# Patient Record
Sex: Female | Born: 1960 | Race: White | Hispanic: No | Marital: Married | Smoking: Never smoker
Health system: Southern US, Community
[De-identification: ages and names within clinical notes are randomized; demographics above are authoritative.]

## PROBLEM LIST (undated history)

## (undated) DIAGNOSIS — I10 Essential (primary) hypertension: Secondary | ICD-10-CM

## (undated) DIAGNOSIS — R011 Cardiac murmur, unspecified: Secondary | ICD-10-CM

## (undated) DIAGNOSIS — E785 Hyperlipidemia, unspecified: Secondary | ICD-10-CM

## (undated) HISTORY — DX: Hyperlipidemia, unspecified: E78.5

## (undated) HISTORY — DX: Cardiac murmur, unspecified: R01.1

## (undated) HISTORY — DX: Essential (primary) hypertension: I10

## (undated) HISTORY — PX: TONSILLECTOMY AND ADENOIDECTOMY: SUR1326

## (undated) HISTORY — PX: BREAST CYST ASPIRATION: SHX578

## (undated) HISTORY — PX: LAPAROSCOPIC ENDOMETRIOSIS FULGURATION: SUR769

## (undated) HISTORY — PX: CHOLECYSTECTOMY: SHX55

---

## 2019-09-16 ENCOUNTER — Encounter: Payer: Self-pay | Admitting: Internal Medicine

## 2019-09-16 ENCOUNTER — Ambulatory Visit (INDEPENDENT_AMBULATORY_CARE_PROVIDER_SITE_OTHER): Payer: 59 | Admitting: Internal Medicine

## 2019-09-16 VITALS — BP 122/78 | HR 69 | Temp 97.5°F | Wt 104.0 lb

## 2019-09-16 DIAGNOSIS — F419 Anxiety disorder, unspecified: Secondary | ICD-10-CM | POA: Diagnosis not present

## 2019-09-16 DIAGNOSIS — F329 Major depressive disorder, single episode, unspecified: Secondary | ICD-10-CM

## 2019-09-16 DIAGNOSIS — I10 Essential (primary) hypertension: Secondary | ICD-10-CM

## 2019-09-16 DIAGNOSIS — E7800 Pure hypercholesterolemia, unspecified: Secondary | ICD-10-CM

## 2019-09-16 DIAGNOSIS — R000 Tachycardia, unspecified: Secondary | ICD-10-CM

## 2019-09-16 DIAGNOSIS — L603 Nail dystrophy: Secondary | ICD-10-CM

## 2019-09-16 DIAGNOSIS — F32A Depression, unspecified: Secondary | ICD-10-CM

## 2019-09-16 DIAGNOSIS — L659 Nonscarring hair loss, unspecified: Secondary | ICD-10-CM

## 2019-09-19 ENCOUNTER — Encounter: Payer: Self-pay | Admitting: Internal Medicine

## 2019-09-19 DIAGNOSIS — R000 Tachycardia, unspecified: Secondary | ICD-10-CM | POA: Insufficient documentation

## 2019-09-19 DIAGNOSIS — E785 Hyperlipidemia, unspecified: Secondary | ICD-10-CM | POA: Insufficient documentation

## 2019-09-19 DIAGNOSIS — F32A Depression, unspecified: Secondary | ICD-10-CM | POA: Insufficient documentation

## 2019-09-19 DIAGNOSIS — I10 Essential (primary) hypertension: Secondary | ICD-10-CM | POA: Insufficient documentation

## 2019-09-19 NOTE — Assessment & Plan Note (Signed)
We will continue Ativan as needed

## 2019-09-19 NOTE — Assessment & Plan Note (Signed)
Heart rate within normal limits We will monitor

## 2019-09-19 NOTE — Assessment & Plan Note (Signed)
We will check lipid with annual exam Encouraged her to consume a low-fat diet We will monitor

## 2019-09-19 NOTE — Assessment & Plan Note (Signed)
Continue Losartan and HCTZ We will monitor

## 2019-09-19 NOTE — Progress Notes (Signed)
HPI  Patient presents to the clinic today to establish care and for management of the conditions listed below.  She recently moved from [place].  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  This occurred after the death of her mother but has not been an issue since.  She reports intermittent anxiety for which she takes Ativan every 5 to 6 months as needed.  She is not currently seeing a therapist.  She denies SI/HI.  Tachyarrhythmia: She reports she was unable to tolerate Metoprolol, so she was taken off of this.  She reports her heart rate is generally fine.  HTN: Her BP today is 122/78.  She is taking HCTZ and Losartan as prescribed.  There is no ECG on file.  HLD: She reports she had to stop taking her statin due to leg cramps.  She was switched to Rosuvastatin but this caused memory loss.  She reports she was taken off of her cholesterol medication and is not currently taking anything OTC for this.  She tries to consume a low-fat diet.  She reports history of elevated liver enzymes and family history of idiopathic hepatic fibrosis.  She is also concerned that her nails seem to be peeling and her hair seems to be thinning.  She reports she has never had a thyroid problem.  Past Medical History:  Diagnosis Date  . Hyperlipidemia   . Hypertension     Current Outpatient Medications  Medication Sig Dispense Refill  . Cholecalciferol (VITAMIN D) 50 MCG (2000 UT) CAPS Take by mouth.    . estradiol (ESTRACE) 0.1 MG/GM vaginal cream Place 1 Applicatorful vaginally at bedtime.    . hydrochlorothiazide (HYDRODIURIL) 12.5 MG tablet Take 12.5 mg by mouth daily.    Marland Kitchen LORazepam (ATIVAN) 0.5 MG tablet Take 0.5 mg by mouth as needed for anxiety.    Marland Kitchen losartan (COZAAR) 50 MG tablet Take 50 mg by mouth daily.     No current facility-administered medications for this visit.    Allergies  Allergen Reactions  . Lisinopril Other (See Comments)    Dry cough    . Metoprolol  Other (See Comments)    Vivid dreams, dizziness    Family History  Problem Relation Age of Onset  . Lymphoma Mother   . Hypertension Mother   . Hyperlipidemia Mother   . Arthritis Father   . Hyperlipidemia Father   . Hypertension Father   . Stroke Father   . Hyperlipidemia Brother   . Hypertension Brother   . Prostate cancer Brother   . Skin cancer Brother   . Arthritis Maternal Grandmother   . Lymphoma Maternal Grandmother   . Stroke Maternal Grandmother   . Brain cancer Maternal Grandfather   . Hypertension Maternal Grandfather   . Hyperlipidemia Maternal Grandfather   . Heart disease Maternal Grandfather   . Heart disease Paternal Grandmother   . Hyperlipidemia Paternal Grandmother   . Hypertension Paternal Grandmother   . Stroke Paternal Grandfather   . Arthritis Paternal Grandfather   . Hypertension Brother   . Hyperlipidemia Brother   . Prostate cancer Brother   . Skin cancer Brother     Social History   Socioeconomic History  . Marital status: Married    Spouse name: Not on file  . Number of children: Not on file  . Years of education: Not on file  . Highest education level: Not on file  Occupational History  . Not on file  Tobacco Use  . Smoking  status: Never Smoker  . Smokeless tobacco: Never Used  Substance and Sexual Activity  . Alcohol use: Yes    Comment: 2 glasses at night  . Drug use: Not on file  . Sexual activity: Not on file  Other Topics Concern  . Not on file  Social History Narrative  . Not on file   Social Determinants of Health   Financial Resource Strain:   . Difficulty of Paying Living Expenses:   Food Insecurity:   . Worried About Programme researcher, broadcasting/film/video in the Last Year:   . Barista in the Last Year:   Transportation Needs:   . Freight forwarder (Medical):   Marland Kitchen Lack of Transportation (Non-Medical):   Physical Activity:   . Days of Exercise per Week:   . Minutes of Exercise per Session:   Stress:   . Feeling of  Stress :   Social Connections:   . Frequency of Communication with Friends and Family:   . Frequency of Social Gatherings with Friends and Family:   . Attends Religious Services:   . Active Member of Clubs or Organizations:   . Attends Banker Meetings:   Marland Kitchen Marital Status:   Intimate Partner Violence:   . Fear of Current or Ex-Partner:   . Emotionally Abused:   Marland Kitchen Physically Abused:   . Sexually Abused:     ROS:  Constitutional: Denies fever, malaise, fatigue, headache or abrupt weight changes.  HEENT: Denies eye pain, eye redness, ear pain, ringing in the ears, wax buildup, runny nose, nasal congestion, bloody nose, or sore throat. Respiratory: Denies difficulty breathing, shortness of breath, cough or sputum production.   Cardiovascular: Denies chest pain, chest tightness, palpitations or swelling in the hands or feet.  Gastrointestinal: Denies abdominal pain, bloating, constipation, diarrhea or blood in the stool.  GU: Denies frequency, urgency, pain with urination, blood in urine, odor or discharge. Musculoskeletal: Denies decrease in range of motion, difficulty with gait, muscle pain or joint pain and swelling.  Skin: Patient reports hair thinning, nail peeling.  Denies redness, rashes, lesions or ulcercations.  Neurological: Denies dizziness, difficulty with memory, difficulty with speech or problems with balance and coordination.  Psych: Patient reports intermittent anxiety.  Denies anxiety, depression, SI/HI.  No other specific complaints in a complete review of systems (except as listed in HPI above).  PE:  BP 122/78   Pulse 69   Temp (!) 97.5 F (36.4 C) (Temporal)   Wt 104 lb (47.2 kg)   SpO2 98%  Wt Readings from Last 3 Encounters:  09/16/19 104 lb (47.2 kg)    General: Appears her stated age, well developed, well nourished in NAD. Skin: Superficial peeling of multiple nails starting at the tips.  No bald spots noted HEENT: Head: normal shape and  size; Eyes: sclera white, no icterus, conjunctiva pink, PERRLA and EOMs intact; Neck: Neck supple, trachea midline. No masses, lumps or thyromegaly present.  Cardiovascular: Normal rate and rhythm. S1,S2 noted.  No murmur, rubs or gallops noted. No JVD or BLE edema. No carotid bruits noted. Pulmonary/Chest: Normal effort and positive vesicular breath sounds. No respiratory distress. No wheezes, rales or ronchi noted.  Musculoskeletal:  No difficulty with gait.  Neurological: Alert and oriented.  Coordination normal.  Psychiatric: Mood and affect normal. Behavior is normal. Judgment and thought content normal.     Assessment and Plan:  Nail Peeling, Hair Thinning:  We will have her try biotin 10,000 mcg daily  Make  an appointment for your annual exam Nicki Reaper, NP This visit occurred during the SARS-CoV-2 public health emergency.  Safety protocols were in place, including screening questions prior to the visit, additional usage of staff PPE, and extensive cleaning of exam room while observing appropriate contact time as indicated for disinfecting solutions.

## 2019-09-19 NOTE — Patient Instructions (Signed)
Alopecia Areata, Adult  Alopecia areata is a condition that causes you to lose hair. You may lose hair on your scalp in patches. In some cases, you may lose all the hair on your scalp (alopecia totalis) or all the hair from your face and body (alopecia universalis). Alopecia areata is an autoimmune disease. This means that your body's defense system (immune system) mistakes normal parts of the body for germs or other things that can make you sick. When you have alopecia areata, the immune system attacks the hair follicles. Alopecia areata usually develops in childhood, but it can develop at any age. For some people, their hair grows back on its own and hair loss does not happen again. For others, their hair may fall out and grow back in cycles. The hair loss may last many years. Having this condition can be emotionally difficult, but it is not dangerous. What are the causes? The cause of this condition is not known. What increases the risk? This condition is more likely to develop in people who have:  A family history of alopecia.  A family history of another autoimmune disease, including type 1 diabetes and rheumatoid arthritis.  Asthma and allergies.  Down syndrome. What are the signs or symptoms? Round spots of patchy hair loss on the scalp is the main symptom of this condition. The spots may be mildly itchy. Other symptoms include:  Short dark hairs in the bald patches that are wider at the top (exclamation point hairs).  Dents, white spots, or lines in the fingernails or toenails.  Balding and body hair loss. This is rare. How is this diagnosed? This condition is diagnosed based on your symptoms and family history. Your health care provider will also check your scalp skin, teeth, and nails. Your health care provider may refer you to a specialist in hair and skin disorders (dermatologist). You may also have tests, including:  A hair pull test.  Blood tests or other screening tests  to check for autoimmune diseases, such as thyroid disease or diabetes.  Skin biopsy to confirm the diagnosis.  A procedure to examine the skin with a lighted magnifying instrument (dermoscopy). How is this treated? There is no cure for alopecia areata. Treatment is aimed at promoting the regrowth of hair and preventing the immune system from overreacting. No single treatment is right for all people with alopecia areata. It depends on the type of hair loss you have and how severe it is. Work with your health care provider to find the best treatment for you. Treatment may include:  Having regular checkups to make sure the condition is not getting worse (watchful waiting).  Steroid creams or pills for 6-8 weeks to stop the immune reaction and help hair to regrow more quickly.  Other topical medicines to alter the immune system response and support the hair growth cycle.  Steroid injections.  Therapy and counseling with a support group or therapist if you are having trouble coping with hair loss. Follow these instructions at home:  Learn as much as you can about your condition.  Apply topical creams only as told by your health care provider.  Take over-the-counter and prescription medicines only as told by your health care provider.  Consider getting a wig or products to make hair look fuller or to cover bald spots, if you feel uncomfortable with your appearance.  Get therapy or counseling if you are having a hard time coping with hair loss. Ask your health care provider to recommend   a counselor or support group.  Keep all follow-up visits as told by your health care provider. This is important. Contact a health care provider if:  Your hair loss gets worse, even with treatment.  You have new symptoms.  You are struggling emotionally. Summary  Alopecia areata is an autoimmune condition that makes your body's defense system (immune system) attack the hair follicles. This causes you  to lose hair.  Treatments may include regular checkups to make sure that the condition is not getting worse (watchful waiting), medicines, and steroid injections. This information is not intended to replace advice given to you by your health care provider. Make sure you discuss any questions you have with your health care provider. Document Revised: 01/30/2017 Document Reviewed: 03/07/2016 Elsevier Patient Education  2020 Elsevier Inc.  

## 2019-11-14 ENCOUNTER — Other Ambulatory Visit: Payer: Self-pay

## 2019-11-14 ENCOUNTER — Ambulatory Visit (INDEPENDENT_AMBULATORY_CARE_PROVIDER_SITE_OTHER)
Admission: RE | Admit: 2019-11-14 | Discharge: 2019-11-14 | Disposition: A | Discharge status: Home / Self Care | Payer: 59 | Source: Ambulatory Visit | Attending: Internal Medicine | Admitting: Internal Medicine

## 2019-11-14 ENCOUNTER — Encounter: Payer: Self-pay | Admitting: Internal Medicine

## 2019-11-14 ENCOUNTER — Ambulatory Visit (INDEPENDENT_AMBULATORY_CARE_PROVIDER_SITE_OTHER): Payer: 59 | Admitting: Internal Medicine

## 2019-11-14 VITALS — BP 118/78 | HR 81 | Temp 98.2°F | Wt 103.0 lb

## 2019-11-14 DIAGNOSIS — M79604 Pain in right leg: Secondary | ICD-10-CM | POA: Diagnosis not present

## 2019-11-14 DIAGNOSIS — L659 Nonscarring hair loss, unspecified: Secondary | ICD-10-CM | POA: Diagnosis not present

## 2019-11-14 DIAGNOSIS — M25551 Pain in right hip: Secondary | ICD-10-CM

## 2019-11-14 DIAGNOSIS — L609 Nail disorder, unspecified: Secondary | ICD-10-CM | POA: Diagnosis not present

## 2019-11-14 DIAGNOSIS — M545 Low back pain, unspecified: Secondary | ICD-10-CM

## 2019-11-14 DIAGNOSIS — R5382 Chronic fatigue, unspecified: Secondary | ICD-10-CM

## 2019-11-14 DIAGNOSIS — E7800 Pure hypercholesterolemia, unspecified: Secondary | ICD-10-CM | POA: Diagnosis not present

## 2019-11-14 DIAGNOSIS — M79605 Pain in left leg: Secondary | ICD-10-CM | POA: Diagnosis not present

## 2019-11-14 LAB — COMPREHENSIVE METABOLIC PANEL
ALT: 23 U/L (ref 0–35)
AST: 25 U/L (ref 0–37)
Albumin: 4.6 g/dL (ref 3.5–5.2)
Alkaline Phosphatase: 39 U/L (ref 39–117)
BUN: 10 mg/dL (ref 6–23)
CO2: 28 mEq/L (ref 19–32)
Calcium: 9.8 mg/dL (ref 8.4–10.5)
Chloride: 101 mEq/L (ref 96–112)
Creatinine, Ser: 0.67 mg/dL (ref 0.40–1.20)
GFR: 89.66 mL/min (ref >60.00)
Glucose, Bld: 82 mg/dL (ref 70–99)
Potassium: 4.2 mEq/L (ref 3.5–5.1)
Sodium: 141 mEq/L (ref 135–145)
Total Bilirubin: 0.6 mg/dL (ref 0.2–1.2)
Total Protein: 7.2 g/dL (ref 6.0–8.3)

## 2019-11-14 LAB — LIPID PANEL
Cholesterol: 229 mg/dL — ABNORMAL HIGH (ref 0–200)
HDL: 70.5 mg/dL (ref >39.00)
LDL Cholesterol: 137 mg/dL — ABNORMAL HIGH (ref 0–99)
NonHDL: 158.81
Total CHOL/HDL Ratio: 3
Triglycerides: 110 mg/dL (ref 0.0–149.0)
VLDL: 22 mg/dL (ref 0.0–40.0)

## 2019-11-14 LAB — CBC
HCT: 36.6 % (ref 36.0–46.0)
Hemoglobin: 12.8 g/dL (ref 12.0–15.0)
MCHC: 34.9 g/dL (ref 30.0–36.0)
MCV: 96.7 fl (ref 78.0–100.0)
Platelets: 216 10*3/uL (ref 150.0–400.0)
RBC: 3.78 Mil/uL — ABNORMAL LOW (ref 3.87–5.11)
RDW: 12.1 % (ref 11.5–15.5)
WBC: 6.2 10*3/uL (ref 4.0–10.5)

## 2019-11-14 LAB — SEDIMENTATION RATE: Sed Rate: 9 mm/hr (ref 0–30)

## 2019-11-14 LAB — VITAMIN D 25 HYDROXY (VIT D DEFICIENCY, FRACTURES): VITD: 55.96 ng/mL (ref 30.00–100.00)

## 2019-11-14 LAB — HIGH SENSITIVITY CRP: CRP, High Sensitivity: 6.29 mg/L — ABNORMAL HIGH (ref 0.000–5.000)

## 2019-11-14 LAB — VITAMIN B12: Vitamin B-12: 369 pg/mL (ref 211–911)

## 2019-11-14 LAB — TSH: TSH: 1.07 u[IU]/mL (ref 0.35–4.50)

## 2019-11-14 NOTE — Patient Instructions (Signed)
Joint Pain  Joint pain can be caused by many things. It is likely to go away if you follow instructions from your doctor for taking care of yourself at home. Sometimes, you may need more treatment. Follow these instructions at home: Managing pain, stiffness, and swelling   If told, put ice on the painful area. ? Put ice in a plastic bag. ? Place a towel between your skin and the bag. ? Leave the ice on for 20 minutes, 2-3 times a day.  If told, put heat on the painful area. Do this as often as told by your doctor. Use the heat source that your doctor recommends, such as a moist heat pack or a heating pad. ? Place a towel between your skin and the heat source. ? Leave the heat on for 20-30 minutes. ? Take off the heat if your skin gets bright red. This is especially important if you are unable to feel pain, heat, or cold. You may have a greater risk of getting burned.  Move your fingers or toes below the painful joint often. This helps with stiffness and swelling.  If possible, raise (elevate) the painful joint above the level of your heart while you are sitting or lying down. To do this, try putting a few pillows under the painful joint. Activity  Rest the painful joint for as long as told. Do not do things that cause pain or make your pain worse.  Begin exercising or stretching the affected area, as told by your doctor. Ask your doctor what types of exercise are safe for you. If you have an elastic bandage, sling, or splint:  Wear the device as told by your doctor. Take it off only as told by your doctor.  Loosen the device if your fingers or toes below the joint: ? Tingle. ? Lose feeling (get numb). ? Get cold and blue.  Keep the device clean.  Ask your doctor if you should take off the device before bathing. You may need to cover it with a watertight covering when you take a bath or a shower. General instructions  Take over-the-counter and prescription medicines only as told  by your doctor.  Do not use any products that contain nicotine or tobacco. These include cigarettes and e-cigarettes. If you need help quitting, ask your doctor.  Keep all follow-up visits as told by your doctor. This is important. Contact a doctor if:  You have pain that gets worse and does not get better with medicine.  Your joint pain does not get better in 3 days.  You have more bruising or swelling.  You have a fever.  You lose 10 lb (4.5 kg) or more without trying. Get help right away if:  You cannot move the joint.  Your fingers or toes tingle, lose feeling, or get cold and blue.  You have a fever along with a joint that is red, warm, and swollen. Summary  Joint pain can be caused by many things. It often goes away if you follow instructions from your doctor for taking care of yourself at home.  Rest the painful joint for as long as told. Do not do things that cause pain or make your pain worse.  Take over-the-counter and prescription medicines only as told by your doctor. This information is not intended to replace advice given to you by your health care provider. Make sure you discuss any questions you have with your health care provider. Document Revised: 01/30/2017 Document Reviewed: 12/03/2016 Elsevier   Patient Education  2020 Elsevier Inc.  

## 2019-11-14 NOTE — Progress Notes (Signed)
Subjective:    Patient ID: Katie Martinez, female    DOB: 02-25-1961, 59 y.o.   MRN: [id number]  HPI  Patient presents the clinic today for follow-up of peeling nails, thinning hair. She was advised to try Biotin 10000 mcg daily. She has not noticed much improvement since starting the Biotin.  She also reports intermittent neck, lower back, hips and lower legs. This started about 2.5 weeks ago. She describes the pain as achy, intermittently sharp but can be worse with movements or laying on that side. She denies numbness, tinging or weakness of the lower extremities. She denies recent falls or injury to the area. She has taken Motrin OTC with some relief.  She also reports chronic fatigue, nausea and intermittent loose stools. She noticed this a few years ago, but worse after getting her gallbladder removed in 2020. She has had some bloating but denies abdominal pain, vomiting or blood in her stool. She denies recent changes in diet, only new meds is Biotin.  Review of Systems      Past Medical History:  Diagnosis Date  . Hyperlipidemia   . Hypertension     Current Outpatient Medications  Medication Sig Dispense Refill  . Cholecalciferol (VITAMIN D) 50 MCG (2000 UT) CAPS Take by mouth.    . estradiol (ESTRACE) 0.1 MG/GM vaginal cream Place 1 Applicatorful vaginally at bedtime.    . hydrochlorothiazide (HYDRODIURIL) 12.5 MG tablet Take 12.5 mg by mouth daily.    Marland Kitchen LORazepam (ATIVAN) 0.5 MG tablet Take 0.5 mg by mouth as needed for anxiety.    Marland Kitchen losartan (COZAAR) 50 MG tablet Take 50 mg by mouth daily.     No current facility-administered medications for this visit.    Allergies  Allergen Reactions  . Lisinopril Other (See Comments)    Dry cough    . Metoprolol Other (See Comments)    Vivid dreams, dizziness    Family History  Problem Relation Age of Onset  . Lymphoma Mother   . Hypertension Mother   . Hyperlipidemia Mother   . Arthritis Father   .  Hyperlipidemia Father   . Hypertension Father   . Stroke Father   . Hyperlipidemia Brother   . Hypertension Brother   . Prostate cancer Brother   . Skin cancer Brother   . Arthritis Maternal Grandmother   . Lymphoma Maternal Grandmother   . Stroke Maternal Grandmother   . Brain cancer Maternal Grandfather   . Hypertension Maternal Grandfather   . Hyperlipidemia Maternal Grandfather   . Heart disease Maternal Grandfather   . Heart disease Paternal Grandmother   . Hyperlipidemia Paternal Grandmother   . Hypertension Paternal Grandmother   . Stroke Paternal Grandfather   . Arthritis Paternal Grandfather   . Hypertension Brother   . Hyperlipidemia Brother   . Prostate cancer Brother   . Skin cancer Brother     Social History   Socioeconomic History  . Marital status: Married    Spouse name: Not on file  . Number of children: Not on file  . Years of education: Not on file  . Highest education level: Not on file  Occupational History  . Not on file  Tobacco Use  . Smoking status: Never Smoker  . Smokeless tobacco: Never Used  Substance and Sexual Activity  . Alcohol use: Yes    Comment: 2 glasses at night  . Drug use: Not on file  . Sexual activity: Not on file  Other Topics Concern  .  Not on file  Social History Narrative  . Not on file   Social Determinants of Health   Financial Resource Strain:   . Difficulty of Paying Living Expenses: Not on file  Food Insecurity:   . Worried About Charity fundraiser in the Last Year: Not on file  . Ran Out of Food in the Last Year: Not on file  Transportation Needs:   . Lack of Transportation (Medical): Not on file  . Lack of Transportation (Non-Medical): Not on file  Physical Activity:   . Days of Exercise per Week: Not on file  . Minutes of Exercise per Session: Not on file  Stress:   . Feeling of Stress : Not on file  Social Connections:   . Frequency of Communication with Friends and Family: Not on file  .  Frequency of Social Gatherings with Friends and Family: Not on file  . Attends Religious Services: Not on file  . Active Member of Clubs or Organizations: Not on file  . Attends Archivist Meetings: Not on file  . Marital Status: Not on file  Intimate Partner Violence:   . Fear of Current or Ex-Partner: Not on file  . Emotionally Abused: Not on file  . Physically Abused: Not on file  . Sexually Abused: Not on file     Constitutional: Denies fever, malaise, fatigue, headache or abrupt weight changes.  HEENT: Denies eye pain, eye redness, ear pain, ringing in the ears, wax buildup, runny nose, nasal congestion, bloody nose, or sore throat. Respiratory: Denies difficulty breathing, shortness of breath, cough or sputum production.   Cardiovascular: Pt reports intermittent tachycardia. Denies chest pain, chest tightness, or swelling in the hands or feet.  Gastrointestinal: Pt reports intermittent nausea, bloating and loose stools. Denies abdominal pain, constipation, or blood in the stool.  GU: Denies urgency, frequency, pain with urination, burning sensation, blood in urine, odor or discharge. Musculoskeletal: Pt reports neck, low back, right hip and bilateral leg pain. Denies decrease in range of motion, difficulty with gait, or joint swelling.  Skin: Pt reports peeling nails, thinning hair. Denies redness, rashes, lesions or ulcercations.  Neurological: Denies dizziness, numbness, tingling, weakness or problems with balance and coordination.   No other specific complaints in a complete review of systems (except as listed in HPI above).  Objective:   Physical Exam  BP 118/78   Pulse 81   Temp 98.2 F (36.8 C) (Temporal)   Wt 103 lb (46.7 kg)   SpO2 98%   Wt Readings from Last 3 Encounters:  09/16/19 104 lb (47.2 kg)    General: Appears her stated age, well developed, well nourished in NAD. Skin: Warm, dry and intact. No rashes noted. HEENT: Head: normal shape and size;  Eyes: sclera white, no icterus, conjunctiva pink, PERRLA and EOMs intact;  Neck:  Neck supple, trachea midline. No masses, lumps or thyromegaly present.  Cardiovascular: Normal rate and rhythm.  Pulmonary/Chest: Normal effort and positive vesicular breath sounds. No respiratory distress. No wheezes, rales or ronchi noted.  Abdomen: Soft and nontender. Normal bowel sounds. No distention or masses noted.  Musculoskeletal: Normal flexion, extension and rotation of the spine. Bony tenderness noted over the cervical and lumbar spine, bilateral SI joints. Normal flexion, extension, abduction and adduction of her right hip. Pain with internal and external rotation of the right hip. Normal abduction, adduction, internal and external rotation of the left hip. Strength 5/5 BLE. No difficulty with gait.  Neurological: Alert and oriented.  Assessment & Plan:  Hair Loss, Peeling Nails:  Continue Biotin Will check TSH, Vit D and B12 today  Acute Neck Pain, Acute Low Back Pain, Bilateral Leg Pain:  Xray lumbar spine Xray right hip Encouraged daily stretching, heat and massage Will check ESR, CRP, RF and ANA today  Chronic Fatigue, Nausea and Loose Stools:  Will check CBC, CMET Likely post surgical gallbladder changes Start Famotidine 10 mg PO QHS  HLD:  CMET and Lipid profile today  Will follow up after labs, return precautions discussed  Webb Silversmith, NP This visit occurred during the SARS-CoV-2 public health emergency.  Safety protocols were in place, including screening questions prior to the visit, additional usage of staff PPE, and extensive cleaning of exam room while observing appropriate contact time as indicated for disinfecting solutions.

## 2019-11-15 LAB — RHEUMATOID FACTOR: Rheumatoid fact SerPl-aCnc: <14 IU/mL (ref <14)

## 2019-11-15 LAB — ANA: Anti Nuclear Antibody (ANA): NEGATIVE

## 2019-11-17 ENCOUNTER — Encounter: Payer: Self-pay | Admitting: Internal Medicine

## 2019-11-17 DIAGNOSIS — R7982 Elevated C-reactive protein (CRP): Secondary | ICD-10-CM

## 2019-11-18 ENCOUNTER — Encounter: Payer: Self-pay | Admitting: Internal Medicine

## 2019-11-18 MED ORDER — PREDNISONE 10 MG PO TABS: ORAL_TABLET | ORAL | 0 refills | Status: DC

## 2019-11-18 NOTE — Addendum Note (Signed)
Addended by: Lorre Munroe on: 11/18/2019 02:01 AM   Modules accepted: Orders

## 2019-11-28 ENCOUNTER — Other Ambulatory Visit (INDEPENDENT_AMBULATORY_CARE_PROVIDER_SITE_OTHER): Payer: 59

## 2019-11-28 ENCOUNTER — Other Ambulatory Visit: Payer: Self-pay

## 2019-11-28 DIAGNOSIS — R7982 Elevated C-reactive protein (CRP): Secondary | ICD-10-CM | POA: Diagnosis not present

## 2019-11-28 LAB — HIGH SENSITIVITY CRP: CRP, High Sensitivity: 0.22 mg/L (ref 0.000–5.000)

## 2019-12-07 ENCOUNTER — Encounter: Payer: Self-pay | Admitting: Internal Medicine

## 2019-12-07 ENCOUNTER — Other Ambulatory Visit: Payer: Self-pay

## 2019-12-07 ENCOUNTER — Ambulatory Visit (INDEPENDENT_AMBULATORY_CARE_PROVIDER_SITE_OTHER): Payer: 59 | Admitting: Internal Medicine

## 2019-12-07 VITALS — BP 120/78 | HR 75 | Temp 97.6°F | Wt 107.0 lb

## 2019-12-07 DIAGNOSIS — M2550 Pain in unspecified joint: Secondary | ICD-10-CM

## 2019-12-07 DIAGNOSIS — M791 Myalgia, unspecified site: Secondary | ICD-10-CM | POA: Diagnosis not present

## 2019-12-07 NOTE — Progress Notes (Signed)
Subjective:    Patient ID: Katie Martinez, female    DOB: 18-Jul-1960, 59 y.o.   MRN: [id number]  HPI  Pt presents to the clinic today for follow up of joint pain in her neck, lower back, hips and legs. She describes the pain as achy, but can be intermittently sharp. The pain is worse with movement or laying on her side. She has some sensitivity to light touch of the skin and muscles. She denies numbness, tingling or weakness of the upper or lower extremities. She had a negative autoimmune workup 11/14/19 although CRP was elevated. After 2 week, CRP had normalize. She was given Prednisone which she reports improved her pain but it still persists. She has noticed that heat is more helpful.  Review of Systems      Past Medical History:  Diagnosis Date  . Hyperlipidemia   . Hypertension     Current Outpatient Medications  Medication Sig Dispense Refill  . Cholecalciferol (VITAMIN D) 50 MCG (2000 UT) CAPS Take by mouth.    . estradiol (ESTRACE) 0.1 MG/GM vaginal cream Place 1 Applicatorful vaginally at bedtime.    . hydrochlorothiazide (HYDRODIURIL) 12.5 MG tablet Take 12.5 mg by mouth daily.    Marland Kitchen LORazepam (ATIVAN) 0.5 MG tablet Take 0.5 mg by mouth as needed for anxiety.    Marland Kitchen losartan (COZAAR) 50 MG tablet Take 50 mg by mouth daily.    . Multiple Vitamin (MULTIVITAMIN) tablet Take 1 tablet by mouth daily.    . predniSONE (DELTASONE) 10 MG tablet Take 3 tabs on days 1-3, take 2 tabs on days 4-6, take 1 tab on days 7-9 18 tablet 0  . vitamin C (ASCORBIC ACID) 250 MG tablet Take 250 mg by mouth daily.     No current facility-administered medications for this visit.    Allergies  Allergen Reactions  . Lisinopril Other (See Comments)    Dry cough    . Metoprolol Other (See Comments)    Vivid dreams, dizziness    Family History  Problem Relation Age of Onset  . Lymphoma Mother   . Hypertension Mother   . Hyperlipidemia Mother   . Arthritis Father   . Hyperlipidemia  Father   . Hypertension Father   . Stroke Father   . Hyperlipidemia Brother   . Hypertension Brother   . Prostate cancer Brother   . Skin cancer Brother   . Arthritis Maternal Grandmother   . Lymphoma Maternal Grandmother   . Stroke Maternal Grandmother   . Brain cancer Maternal Grandfather   . Hypertension Maternal Grandfather   . Hyperlipidemia Maternal Grandfather   . Heart disease Maternal Grandfather   . Heart disease Paternal Grandmother   . Hyperlipidemia Paternal Grandmother   . Hypertension Paternal Grandmother   . Stroke Paternal Grandfather   . Arthritis Paternal Grandfather   . Hypertension Brother   . Hyperlipidemia Brother   . Prostate cancer Brother   . Skin cancer Brother     Social History   Socioeconomic History  . Marital status: Married    Spouse name: Not on file  . Number of children: Not on file  . Years of education: Not on file  . Highest education level: Not on file  Occupational History  . Not on file  Tobacco Use  . Smoking status: Never Smoker  . Smokeless tobacco: Never Used  Substance and Sexual Activity  . Alcohol use: Yes    Comment: 2 glasses at night  . Drug use:  Not on file  . Sexual activity: Not on file  Other Topics Concern  . Not on file  Social History Narrative  . Not on file   Social Determinants of Health   Financial Resource Strain:   . Difficulty of Paying Living Expenses: Not on file  Food Insecurity:   . Worried About Programme researcher, broadcasting/film/video in the Last Year: Not on file  . Ran Out of Food in the Last Year: Not on file  Transportation Needs:   . Lack of Transportation (Medical): Not on file  . Lack of Transportation (Non-Medical): Not on file  Physical Activity:   . Days of Exercise per Week: Not on file  . Minutes of Exercise per Session: Not on file  Stress:   . Feeling of Stress : Not on file  Social Connections:   . Frequency of Communication with Friends and Family: Not on file  . Frequency of Social  Gatherings with Friends and Family: Not on file  . Attends Religious Services: Not on file  . Active Member of Clubs or Organizations: Not on file  . Attends Banker Meetings: Not on file  . Marital Status: Not on file  Intimate Partner Violence:   . Fear of Current or Ex-Partner: Not on file  . Emotionally Abused: Not on file  . Physically Abused: Not on file  . Sexually Abused: Not on file     Constitutional: Denies fever, malaise, fatigue, headache or abrupt weight changes.  Respiratory: Denies difficulty breathing, shortness of breath, cough or sputum production.   Cardiovascular: Denies chest pain, chest tightness, palpitations or swelling in the hands or feet.  Musculoskeletal: Pt reports joint pain in neck, back, hips, and knees, muscle pain. Denies decrease in range of motion, difficulty with gait, or joint swelling.  Skin: Denies redness, rashes, lesions or ulcercations.  Neurological: Denies numbness, tingling, weakness, or problems with balance and coordination.    No other specific complaints in a complete review of systems (except as listed in HPI above).  Objective:   Physical Exam   BP 120/78   Pulse 75   Temp 97.6 F (36.4 C) (Temporal)   Wt 107 lb (48.5 kg)   SpO2 98%   Wt Readings from Last 3 Encounters:  11/14/19 103 lb (46.7 kg)  09/16/19 104 lb (47.2 kg)    General: Appears her stated age, well developed, well nourished in NAD. Cardiovascular: Normal rate and rhythm. S1,S2 noted.  No murmur, rubs or gallops noted. No JVD or BLE edema. No carotid bruits noted. Pulmonary/Chest: Normal effort and positive vesicular breath sounds. No respiratory distress. No wheezes, rales or ronchi noted.  Musculoskeletal: Normal flexion, extension and rotation of the spine. Bony tenderness noted over the cervical and lumbar spine, bilateral SI joints. Normal flexion, extension, abduction and adduction of her right hip. Pain with internal and external rotation  of the right hip. Normal abduction, adduction, internal and external rotation of the left hip. Strength 5/5 BLE. Multiple tender spots noted over her anterior shins, upper back, lower back. No difficulty with gait.   Neurological: Alert and oriented.   BMET    Component Value Date/Time   NA 141 11/14/2019 1052   K 4.2 11/14/2019 1052   CL 101 11/14/2019 1052   CO2 28 11/14/2019 1052   GLUCOSE 82 11/14/2019 1052   BUN 10 11/14/2019 1052   CREATININE 0.67 11/14/2019 1052   CALCIUM 9.8 11/14/2019 1052    Lipid Panel  Component Value Date/Time   CHOL 229 (H) 11/14/2019 1052   TRIG 110.0 11/14/2019 1052   HDL 70.50 11/14/2019 1052   CHOLHDL 3 11/14/2019 1052   VLDL 22.0 11/14/2019 1052   LDLCALC 137 (H) 11/14/2019 1052    CBC    Component Value Date/Time   WBC 6.2 11/14/2019 1052   RBC 3.78 (L) 11/14/2019 1052   HGB 12.8 11/14/2019 1052   HCT 36.6 11/14/2019 1052   PLT 216.0 11/14/2019 1052   MCV 96.7 11/14/2019 1052   MCHC 34.9 11/14/2019 1052   RDW 12.1 11/14/2019 1052    Hgb A1C No results found for: HGBA1C         Assessment & Plan:   Multiple Joint Pain, Myalgia's:  Autoimmune workup negative Will refer to ortho for further evaluation of symptoms before settling on a diagnosis of myofascial pain syndrome Encouraged regular stretching, muscle strengthening and toning Can take Ibuprofen 600 mg TID prn  Return precautions discussed Nicki Reaper, NP This visit occurred during the SARS-CoV-2 public health emergency.  Safety protocols were in place, including screening questions prior to the visit, additional usage of staff PPE, and extensive cleaning of exam room while observing appropriate contact time as indicated for disinfecting solutions.

## 2019-12-07 NOTE — Patient Instructions (Signed)
Myofascial Pain Syndrome and Fibromyalgia Myofascial pain syndrome and fibromyalgia are both pain disorders. This pain may be felt mainly in your muscles.  Myofascial pain syndrome: ? Always has tender points in the muscle that will cause pain when pressed (trigger points). The pain may come and go. ? Usually affects your neck, upper back, and shoulder areas. The pain often radiates into your arms and hands.  Fibromyalgia: ? Has muscle pains and tenderness that come and go. ? Is often associated with fatigue and sleep problems. ? Has trigger points. ? Tends to be long-lasting (chronic), but is not life-threatening. Fibromyalgia and myofascial pain syndrome are not the same. However, they often occur together. If you have both conditions, each can make the other worse. Both are common and can cause enough pain and fatigue to make day-to-day activities difficult. Both can be hard to diagnose because their symptoms are common in many other conditions. What are the causes? The exact causes of these conditions are not known. What increases the risk? You are more likely to develop this condition if:  You have a family history of the condition.  You have certain triggers, such as: ? Spine disorders. ? An injury (trauma) or other physical stressors. ? Being under a lot of stress. ? Medical conditions such as osteoarthritis, rheumatoid arthritis, or lupus. What are the signs or symptoms? Fibromyalgia The main symptom of fibromyalgia is widespread pain and tenderness in your muscles. Pain is sometimes described as stabbing, shooting, or burning. You may also have:  Tingling or numbness.  Sleep problems and fatigue.  Problems with attention and concentration (fibro fog). Other symptoms may include:  Bowel and bladder problems.  Headaches.  Visual problems.  Problems with odors and noises.  Depression or mood changes.  Painful menstrual periods (dysmenorrhea).  Dry skin or  eyes. These symptoms can vary over time. Myofascial pain syndrome Symptoms of myofascial pain syndrome include:  Tight, ropy bands of muscle.  Uncomfortable sensations in muscle areas. These may include aching, cramping, burning, numbness, tingling, and weakness.  Difficulty moving certain parts of the body freely (poor range of motion). How is this diagnosed? This condition may be diagnosed by your symptoms and medical history. You will also have a physical exam. In general:  Fibromyalgia is diagnosed if you have pain, fatigue, and other symptoms for more than 3 months, and symptoms cannot be explained by another condition.  Myofascial pain syndrome is diagnosed if you have trigger points in your muscles, and those trigger points are tender and cause pain elsewhere in your body (referred pain). How is this treated? Treatment for these conditions depends on the type that you have.  For fibromyalgia: ? Pain medicines, such as NSAIDs. ? Medicines for treating depression. ? Medicines for treating seizures. ? Medicines that relax the muscles.  For myofascial pain: ? Pain medicines, such as NSAIDs. ? Cooling and stretching of muscles. ? Trigger point injections. ? Sound wave (ultrasound) treatments to stimulate muscles. Treating these conditions often requires a team of health care providers. These may include:  Your primary care provider.  Physical therapist.  Complementary health care providers, such as massage therapists or acupuncturists.  Psychiatrist for cognitive behavioral therapy. Follow these instructions at home: Medicines  Take over-the-counter and prescription medicines only as told by your health care provider.  Do not drive or use heavy machinery while taking prescription pain medicine.  If you are taking prescription pain medicine, take actions to prevent or treat constipation. Your health care   provider may recommend that you: ? Drink enough fluid to keep  your urine pale yellow. ? Eat foods that are high in fiber, such as fresh fruits and vegetables, whole grains, and beans. ? Limit foods that are high in fat and processed sugars, such as fried or sweet foods. ? Take an over-the-counter or prescription medicine for constipation. Lifestyle   Exercise as directed by your health care provider or physical therapist.  Practice relaxation techniques to control your stress. You may want to try: ? Biofeedback. ? Visual imagery. ? Hypnosis. ? Muscle relaxation. ? Yoga. ? Meditation.  Maintain a healthy lifestyle. This includes eating a healthy diet and getting enough sleep.  Do not use any products that contain nicotine or tobacco, such as cigarettes and e-cigarettes. If you need help quitting, ask your health care provider. General instructions  Talk to your health care provider about complementary treatments, such as acupuncture or massage.  Consider joining a support group with others who are diagnosed with this condition.  Do not do activities that stress or strain your muscles. This includes repetitive motions and heavy lifting.  Keep all follow-up visits as told by your health care provider. This is important. Where to find more information  National Fibromyalgia Association: www.fmaware.org  Arthritis Foundation: www.arthritis.org  American Chronic Pain Association: www.theacpa.org Contact a health care provider if:  You have new symptoms.  Your symptoms get worse or your pain is severe.  You have side effects from your medicines.  You have trouble sleeping.  Your condition is causing depression or anxiety. Summary  Myofascial pain syndrome and fibromyalgia are pain disorders.  Myofascial pain syndrome has tender points in the muscle that will cause pain when pressed (trigger points). Fibromyalgia also has muscle pains and tenderness that come and go, but this condition is often associated with fatigue and sleep  disturbances.  Fibromyalgia and myofascial pain syndrome are not the same but often occur together, causing pain and fatigue that make day-to-day activities difficult.  Treatment for fibromyalgia includes taking medicines to relax the muscles and medicines for pain, depression, or seizures. Treatment for myofascial pain syndrome includes taking medicines for pain, cooling and stretching of muscles, and injecting medicines into trigger points.  Follow your health care provider's instructions for taking medicines and maintaining a healthy lifestyle. This information is not intended to replace advice given to you by your health care provider. Make sure you discuss any questions you have with your health care provider. Document Revised: 06/11/2018 Document Reviewed: 03/04/2017 Elsevier Patient Education  2020 Elsevier Inc.  

## 2019-12-13 ENCOUNTER — Encounter: Payer: 59 | Admitting: Internal Medicine

## 2019-12-27 ENCOUNTER — Other Ambulatory Visit: Payer: Self-pay | Admitting: Orthopedic Surgery

## 2019-12-27 ENCOUNTER — Other Ambulatory Visit (HOSPITAL_COMMUNITY): Payer: Self-pay | Admitting: Orthopedic Surgery

## 2019-12-27 DIAGNOSIS — M1611 Unilateral primary osteoarthritis, right hip: Secondary | ICD-10-CM

## 2019-12-27 DIAGNOSIS — M25551 Pain in right hip: Secondary | ICD-10-CM

## 2019-12-28 ENCOUNTER — Other Ambulatory Visit: Payer: Self-pay | Admitting: Internal Medicine

## 2019-12-28 DIAGNOSIS — Z1231 Encounter for screening mammogram for malignant neoplasm of breast: Secondary | ICD-10-CM

## 2020-01-04 ENCOUNTER — Ambulatory Visit
Admission: RE | Admit: 2020-01-04 | Discharge: 2020-01-04 | Disposition: A | Discharge status: Home / Self Care | Payer: 59 | Source: Ambulatory Visit | Attending: Orthopedic Surgery | Admitting: Orthopedic Surgery

## 2020-01-04 ENCOUNTER — Other Ambulatory Visit: Payer: Self-pay

## 2020-01-04 DIAGNOSIS — M1611 Unilateral primary osteoarthritis, right hip: Secondary | ICD-10-CM | POA: Insufficient documentation

## 2020-01-04 DIAGNOSIS — M25551 Pain in right hip: Secondary | ICD-10-CM | POA: Diagnosis present

## 2020-01-09 ENCOUNTER — Other Ambulatory Visit: Payer: Self-pay | Admitting: Orthopedic Surgery

## 2020-01-09 DIAGNOSIS — M5441 Lumbago with sciatica, right side: Secondary | ICD-10-CM

## 2020-01-13 ENCOUNTER — Encounter: Payer: Self-pay | Admitting: Internal Medicine

## 2020-01-13 MED ORDER — LORAZEPAM 0.5 MG PO TABS: 0.5000 mg | ORAL_TABLET | Freq: Every day | ORAL | 0 refills | Status: DC | PRN

## 2020-01-21 ENCOUNTER — Ambulatory Visit
Admission: RE | Admit: 2020-01-21 | Discharge: 2020-01-21 | Disposition: A | Discharge status: Home / Self Care | Payer: 59 | Source: Ambulatory Visit | Attending: Orthopedic Surgery | Admitting: Orthopedic Surgery

## 2020-01-21 ENCOUNTER — Other Ambulatory Visit: Payer: Self-pay

## 2020-01-21 DIAGNOSIS — M5441 Lumbago with sciatica, right side: Secondary | ICD-10-CM | POA: Diagnosis not present

## 2020-01-24 ENCOUNTER — Encounter: Payer: Self-pay | Admitting: Internal Medicine

## 2020-01-25 MED ORDER — HYDROCHLOROTHIAZIDE 12.5 MG PO TABS: 12.5000 mg | ORAL_TABLET | Freq: Every day | ORAL | 0 refills | Status: DC

## 2020-01-25 MED ORDER — LOSARTAN POTASSIUM 50 MG PO TABS: 50.0000 mg | ORAL_TABLET | Freq: Every day | ORAL | 0 refills | Status: DC

## 2020-02-02 ENCOUNTER — Ambulatory Visit: Payer: 59

## 2020-02-03 ENCOUNTER — Other Ambulatory Visit: Payer: Self-pay

## 2020-02-03 ENCOUNTER — Ambulatory Visit
Admission: RE | Admit: 2020-02-03 | Discharge: 2020-02-03 | Disposition: A | Discharge status: Home / Self Care | Payer: 59 | Source: Ambulatory Visit | Attending: Internal Medicine | Admitting: Internal Medicine

## 2020-02-03 DIAGNOSIS — Z1231 Encounter for screening mammogram for malignant neoplasm of breast: Secondary | ICD-10-CM

## 2020-02-08 ENCOUNTER — Other Ambulatory Visit: Payer: Self-pay

## 2020-02-08 ENCOUNTER — Ambulatory Visit (INDEPENDENT_AMBULATORY_CARE_PROVIDER_SITE_OTHER): Payer: 59 | Admitting: Internal Medicine

## 2020-02-08 VITALS — BP 116/70 | HR 80 | Temp 97.1°F | Ht 60.0 in | Wt 105.0 lb

## 2020-02-08 DIAGNOSIS — Z1159 Encounter for screening for other viral diseases: Secondary | ICD-10-CM

## 2020-02-08 DIAGNOSIS — Z114 Encounter for screening for human immunodeficiency virus [HIV]: Secondary | ICD-10-CM

## 2020-02-08 DIAGNOSIS — Z0000 Encounter for general adult medical examination without abnormal findings: Secondary | ICD-10-CM

## 2020-02-08 NOTE — Progress Notes (Signed)
Subjective:    Patient ID: Katie Martinez, female    DOB: 09/10/1960, 59 y.o.   MRN: [id number]  HPI  Pt presents to the clinic today for her annual exam.  Flu: 11/2019 Tetanus: 05/2011 Covid: Pfizer Shingrix: 2020 Pap Smear: 2018 Mammogram: 02/2020 Bone Density: never Colon Screening: 2011 Vision Screening: annually Dentist: biannually  Diet: She does eat meat. She consumes fruits and veggies daily. She does eat some fried foods. She drinks mostly water. Exercise: walking  Review of Systems      Past Medical History:  Diagnosis Date  . Hyperlipidemia   . Hypertension     Current Outpatient Medications  Medication Sig Dispense Refill  . Cholecalciferol (VITAMIN D) 50 MCG (2000 UT) CAPS Take by mouth.    . estradiol (ESTRACE) 0.1 MG/GM vaginal cream Place 1 Applicatorful vaginally at bedtime.    . hydrochlorothiazide (HYDRODIURIL) 12.5 MG tablet Take 1 tablet (12.5 mg total) by mouth daily. 90 tablet 0  . LORazepam (ATIVAN) 0.5 MG tablet Take 1 tablet (0.5 mg total) by mouth daily as needed for anxiety. 20 tablet 0  . losartan (COZAAR) 50 MG tablet Take 1 tablet (50 mg total) by mouth daily. 90 tablet 0  . Multiple Vitamin (MULTIVITAMIN) tablet Take 1 tablet by mouth daily.    . vitamin C (ASCORBIC ACID) 250 MG tablet Take 250 mg by mouth daily.     No current facility-administered medications for this visit.    Allergies  Allergen Reactions  . Lisinopril Other (See Comments)    Dry cough    . Metoprolol Other (See Comments)    Vivid dreams, dizziness    Family History  Problem Relation Age of Onset  . Lymphoma Mother   . Hypertension Mother   . Hyperlipidemia Mother   . Arthritis Father   . Hyperlipidemia Father   . Hypertension Father   . Stroke Father   . Hyperlipidemia Brother   . Hypertension Brother   . Prostate cancer Brother   . Skin cancer Brother   . Arthritis Maternal Grandmother   . Lymphoma Maternal Grandmother   . Stroke  Maternal Grandmother   . Brain cancer Maternal Grandfather   . Hypertension Maternal Grandfather   . Hyperlipidemia Maternal Grandfather   . Heart disease Maternal Grandfather   . Heart disease Paternal Grandmother   . Hyperlipidemia Paternal Grandmother   . Hypertension Paternal Grandmother   . Stroke Paternal Grandfather   . Arthritis Paternal Grandfather   . Hypertension Brother   . Hyperlipidemia Brother   . Prostate cancer Brother   . Skin cancer Brother   . Breast cancer Maternal Aunt   . Breast cancer Maternal Aunt     Social History   Socioeconomic History  . Marital status: Married    Spouse name: Not on file  . Number of children: Not on file  . Years of education: Not on file  . Highest education level: Not on file  Occupational History  . Not on file  Tobacco Use  . Smoking status: Never Smoker  . Smokeless tobacco: Never Used  Substance and Sexual Activity  . Alcohol use: Yes    Comment: 2 glasses at night  . Drug use: Not on file  . Sexual activity: Not on file  Other Topics Concern  . Not on file  Social History Narrative  . Not on file   Social Determinants of Health   Financial Resource Strain: Not on file  Food Insecurity: Not on  file  Transportation Needs: Not on file  Physical Activity: Not on file  Stress: Not on file  Social Connections: Not on file  Intimate Partner Violence: Not on file     Constitutional: Denies fever, malaise, fatigue, headache or abrupt weight changes.  HEENT: Denies eye pain, eye redness, ear pain, ringing in the ears, wax buildup, runny nose, nasal congestion, bloody nose, or sore throat. Respiratory: Denies difficulty breathing, shortness of breath, cough or sputum production.   Cardiovascular: Denies chest pain, chest tightness, palpitations or swelling in the hands or feet.  Gastrointestinal: Denies abdominal pain, bloating, constipation, diarrhea or blood in the stool.  GU: Denies urgency, frequency, pain with  urination, burning sensation, blood in urine, odor or discharge. Musculoskeletal: Pt reports intermittent joint pain. Denies decrease in range of motion, difficulty with gait, muscle pain or joint swelling.  Skin: Denies redness, rashes, lesions or ulcercations.  Neurological: Denies dizziness, difficulty with memory, difficulty with speech or problems with balance and coordination.  Psych: Pt has a history of anxiety and depression. Denies SI/HI.  No other specific complaints in a complete review of systems (except as listed in HPI above).  Objective:   Physical Exam   BP 116/70   Pulse 80   Temp (!) 97.1 F (36.2 C) (Temporal)   Ht 5' (1.524 m)   Wt 105 lb (47.6 kg)   SpO2 98%   BMI 20.51 kg/m  Wt Readings from Last 3 Encounters:  02/08/20 105 lb (47.6 kg)  12/07/19 107 lb (48.5 kg)  11/14/19 103 lb (46.7 kg)    General: Appears her stated age, well developed, well nourished in NAD. Skin: Warm, dry and intact. No rashes noted. HEENT: Head: normal shape and size; Eyes: sclera white, no icterus, conjunctiva pink, PERRLA and EOMs intact;  Neck:  Neck supple, trachea midline. No masses, lumps or thyromegaly present.  Cardiovascular: Normal rate and rhythm. S1,S2 noted.  No murmur, rubs or gallops noted. No JVD or BLE edema. No carotid bruits noted. Pulmonary/Chest: Normal effort and positive vesicular breath sounds. No respiratory distress. No wheezes, rales or ronchi noted.  Abdomen: Soft and nontender. Normal bowel sounds. No distention or masses noted. Liver, spleen and kidneys non palpable. Musculoskeletal: Strength 5/5 BUE/BLE. No difficulty with gait.  Neurological: Alert and oriented. Cranial nerves II-XII grossly intact. Coordination normal.  Psychiatric: Mood and affect normal. Behavior is normal. Judgment and thought content normal.    BMET    Component Value Date/Time   NA 141 11/14/2019 1052   K 4.2 11/14/2019 1052   CL 101 11/14/2019 1052   CO2 28 11/14/2019  1052   GLUCOSE 82 11/14/2019 1052   BUN 10 11/14/2019 1052   CREATININE 0.67 11/14/2019 1052   CALCIUM 9.8 11/14/2019 1052    Lipid Panel     Component Value Date/Time   CHOL 229 (H) 11/14/2019 1052   TRIG 110.0 11/14/2019 1052   HDL 70.50 11/14/2019 1052   CHOLHDL 3 11/14/2019 1052   VLDL 22.0 11/14/2019 1052   LDLCALC 137 (H) 11/14/2019 1052    CBC    Component Value Date/Time   WBC 6.2 11/14/2019 1052   RBC 3.78 (L) 11/14/2019 1052   HGB 12.8 11/14/2019 1052   HCT 36.6 11/14/2019 1052   PLT 216.0 11/14/2019 1052   MCV 96.7 11/14/2019 1052   MCHC 34.9 11/14/2019 1052   RDW 12.1 11/14/2019 1052    Hgb A1C No results found for: HGBA1C  Assessment & Plan:   Preventative Health Maintenance:  Flu shot UTD Tetanus UTD Covid vaccine UTD Shingrix UTD Pap smear UTD- will request copy Mammogram UTD Bone density due- she will schedule this with her mammogram next year She would like to wait until after April to repeat her Colonoscopy- she will let me know when she gets back from [place] Encouraged her to consume a balanced diet and exercise regimen Advised her to see an eye doctor and dentist annually Will check CBC, CMET, Lipid, HIV and Hep C today  RTC in 1 year, sooner if needed Nicki Reaper, NP This visit occurred during the SARS-CoV-2 public health emergency.  Safety protocols were in place, including screening questions prior to the visit, additional usage of staff PPE, and extensive cleaning of exam room while observing appropriate contact time as indicated for disinfecting solutions.

## 2020-02-08 NOTE — Patient Instructions (Signed)

## 2020-02-09 ENCOUNTER — Encounter: Payer: Self-pay | Admitting: Internal Medicine

## 2020-02-09 LAB — LIPID PANEL
Cholesterol: 218 mg/dL — ABNORMAL HIGH (ref 0–200)
HDL: 61.5 mg/dL (ref >39.00)
LDL Cholesterol: 127 mg/dL — ABNORMAL HIGH (ref 0–99)
NonHDL: 156.63
Total CHOL/HDL Ratio: 4
Triglycerides: 150 mg/dL — ABNORMAL HIGH (ref 0.0–149.0)
VLDL: 30 mg/dL (ref 0.0–40.0)

## 2020-02-09 LAB — CBC
HCT: 36.9 % (ref 36.0–46.0)
Hemoglobin: 12.7 g/dL (ref 12.0–15.0)
MCHC: 34.4 g/dL (ref 30.0–36.0)
MCV: 97.3 fl (ref 78.0–100.0)
Platelets: 242 10*3/uL (ref 150.0–400.0)
RBC: 3.8 Mil/uL — ABNORMAL LOW (ref 3.87–5.11)
RDW: 12.1 % (ref 11.5–15.5)
WBC: 6.5 10*3/uL (ref 4.0–10.5)

## 2020-02-09 LAB — HIV ANTIBODY (ROUTINE TESTING W REFLEX): HIV 1&2 Ab, 4th Generation: NONREACTIVE

## 2020-02-09 LAB — HEPATITIS C ANTIBODY
Hepatitis C Ab: NONREACTIVE
SIGNAL TO CUT-OFF: 0.01 (ref <1.00)

## 2020-02-09 LAB — COMPREHENSIVE METABOLIC PANEL
ALT: 18 U/L (ref 0–35)
AST: 19 U/L (ref 0–37)
Albumin: 4.5 g/dL (ref 3.5–5.2)
Alkaline Phosphatase: 34 U/L — ABNORMAL LOW (ref 39–117)
BUN: 14 mg/dL (ref 6–23)
CO2: 31 mEq/L (ref 19–32)
Calcium: 9.2 mg/dL (ref 8.4–10.5)
Chloride: 100 mEq/L (ref 96–112)
Creatinine, Ser: 0.73 mg/dL (ref 0.40–1.20)
GFR: 89.24 mL/min (ref >60.00)
Glucose, Bld: 106 mg/dL — ABNORMAL HIGH (ref 70–99)
Potassium: 3.6 mEq/L (ref 3.5–5.1)
Sodium: 139 mEq/L (ref 135–145)
Total Bilirubin: 0.5 mg/dL (ref 0.2–1.2)
Total Protein: 7 g/dL (ref 6.0–8.3)

## 2020-03-05 ENCOUNTER — Encounter: Payer: Self-pay | Admitting: Internal Medicine

## 2020-03-26 ENCOUNTER — Encounter: Payer: Self-pay | Admitting: Internal Medicine

## 2020-04-22 ENCOUNTER — Other Ambulatory Visit: Payer: Self-pay | Admitting: Internal Medicine

## 2020-07-26 ENCOUNTER — Other Ambulatory Visit: Payer: Self-pay | Admitting: Internal Medicine

## 2020-07-26 MED ORDER — LOSARTAN POTASSIUM 50 MG PO TABS: 1.0000 | ORAL_TABLET | Freq: Every day | ORAL | 0 refills | Status: DC

## 2020-07-26 NOTE — Telephone Encounter (Signed)
Requested Prescriptions  Pending Prescriptions Disp Refills  . losartan (COZAAR) 50 MG tablet [Pharmacy Med Name: LOSARTAN POTASSIUM 50 MG TAB] 90 tablet 0    Sig: TAKE 1 TABLET BY MOUTH EVERY DAY     Cardiovascular:  Angiotensin Receptor Blockers Failed - 07/26/2020 12:22 AM      Failed - Valid encounter within last 6 months    Recent Outpatient Visits   None     Future Appointments            In 6 months Baity, Salvadore Oxford, NP [facility]           Passed - Cr in normal range and within 180 days    Creatinine, Ser  Date Value Ref Range Status  02/08/2020 0.73 0.40 - 1.20 mg/dL Final         Passed - K in normal range and within 180 days    Potassium  Date Value Ref Range Status  02/08/2020 3.6 3.5 - 5.1 mEq/L Final         Passed - Patient is not pregnant      Passed - Last BP in normal range    BP Readings from Last 1 Encounters:  02/08/20 116/70

## 2020-07-26 NOTE — Telephone Encounter (Signed)
[facility] Last rx:  04/23/20, #90 Last OV:  02/08/20, CPE Next OV:  none

## 2020-07-27 MED ORDER — HYDROCHLOROTHIAZIDE 12.5 MG PO TABS: 12.5000 mg | ORAL_TABLET | Freq: Every day | ORAL | 0 refills | Status: DC

## 2020-09-07 ENCOUNTER — Encounter: Payer: Self-pay | Admitting: Internal Medicine

## 2020-09-14 MED ORDER — ONDANSETRON 4 MG PO TBDP: 4.0000 mg | ORAL_TABLET | Freq: Three times a day (TID) | ORAL | 0 refills | Status: DC | PRN

## 2020-09-19 DIAGNOSIS — H8112 Benign paroxysmal vertigo, left ear: Secondary | ICD-10-CM | POA: Diagnosis not present

## 2020-10-21 ENCOUNTER — Other Ambulatory Visit: Payer: Self-pay | Admitting: Family

## 2020-11-05 ENCOUNTER — Other Ambulatory Visit: Payer: Self-pay | Admitting: Internal Medicine

## 2020-11-06 MED ORDER — LORAZEPAM 0.5 MG PO TABS: 0.5000 mg | ORAL_TABLET | Freq: Every day | ORAL | 0 refills | Status: DC | PRN

## 2020-11-18 ENCOUNTER — Encounter (HOSPITAL_BASED_OUTPATIENT_CLINIC_OR_DEPARTMENT_OTHER): Payer: Self-pay | Admitting: Emergency Medicine

## 2020-11-18 ENCOUNTER — Ambulatory Visit: Payer: 59

## 2020-11-18 ENCOUNTER — Emergency Department (HOSPITAL_BASED_OUTPATIENT_CLINIC_OR_DEPARTMENT_OTHER)
Admission: EM | Admit: 2020-11-18 | Discharge: 2020-11-18 | Disposition: A | Discharge status: Home / Self Care | Payer: BC Managed Care – PPO | Attending: Emergency Medicine | Admitting: Emergency Medicine

## 2020-11-18 ENCOUNTER — Other Ambulatory Visit: Payer: Self-pay

## 2020-11-18 DIAGNOSIS — I10 Essential (primary) hypertension: Secondary | ICD-10-CM | POA: Insufficient documentation

## 2020-11-18 DIAGNOSIS — R1110 Vomiting, unspecified: Secondary | ICD-10-CM | POA: Diagnosis not present

## 2020-11-18 DIAGNOSIS — R112 Nausea with vomiting, unspecified: Secondary | ICD-10-CM | POA: Diagnosis not present

## 2020-11-18 DIAGNOSIS — R1111 Vomiting without nausea: Secondary | ICD-10-CM

## 2020-11-18 DIAGNOSIS — Z79899 Other long term (current) drug therapy: Secondary | ICD-10-CM | POA: Diagnosis not present

## 2020-11-18 LAB — CBC WITH DIFFERENTIAL/PLATELET
Abs Immature Granulocytes: 0.02 10*3/uL (ref 0.00–0.07)
Basophils Absolute: 0 10*3/uL (ref 0.0–0.1)
Basophils Relative: 0 %
Eosinophils Absolute: 0 10*3/uL (ref 0.0–0.5)
Eosinophils Relative: 0 %
HCT: 38.6 % (ref 36.0–46.0)
Hemoglobin: 13.3 g/dL (ref 12.0–15.0)
Immature Granulocytes: 0 %
Lymphocytes Relative: 14 %
Lymphs Abs: 1.2 10*3/uL (ref 0.7–4.0)
MCH: 32.9 pg (ref 26.0–34.0)
MCHC: 34.5 g/dL (ref 30.0–36.0)
MCV: 95.5 fL (ref 80.0–100.0)
Monocytes Absolute: 0.5 10*3/uL (ref 0.1–1.0)
Monocytes Relative: 6 %
Neutro Abs: 6.7 10*3/uL (ref 1.7–7.7)
Neutrophils Relative %: 80 %
Platelets: 231 10*3/uL (ref 150–400)
RBC: 4.04 MIL/uL (ref 3.87–5.11)
RDW: 11.9 % (ref 11.5–15.5)
WBC: 8.5 10*3/uL (ref 4.0–10.5)
nRBC: 0 % (ref 0.0–0.2)

## 2020-11-18 LAB — BASIC METABOLIC PANEL
Anion gap: 13 (ref 5–15)
BUN: 12 mg/dL (ref 8–23)
CO2: 25 mmol/L (ref 22–32)
Calcium: 9.7 mg/dL (ref 8.9–10.3)
Chloride: 100 mmol/L (ref 98–111)
Creatinine, Ser: 0.57 mg/dL (ref 0.44–1.00)
GFR, Estimated: >60 mL/min (ref >60)
Glucose, Bld: 89 mg/dL (ref 70–99)
Potassium: 3.8 mmol/L (ref 3.5–5.1)
Sodium: 138 mmol/L (ref 135–145)

## 2020-11-18 MED ORDER — ONDANSETRON 4 MG PO TBDP: 4.0000 mg | ORAL_TABLET | Freq: Three times a day (TID) | ORAL | 0 refills | Status: DC | PRN

## 2020-11-18 MED ORDER — SODIUM CHLORIDE 0.9 % IV BOLUS
1000.0000 mL | Freq: Once | INTRAVENOUS | Status: AC
  Administered 2020-11-18: 1000 mL via INTRAVENOUS

## 2020-11-18 MED ORDER — ONDANSETRON 4 MG PO TBDP
ORAL_TABLET | ORAL | Status: AC
  Administered 2020-11-18: 4 mg
  Filled 2020-11-18: qty 1

## 2020-11-18 NOTE — Discharge Instructions (Addendum)
Your lab tests today were normal.    Continue taking Zofran as needed.  Return to the ER if you have fevers or persistent pain, or cannot keep down any fluids.   Call your primary care doctor or specialist as discussed in the next 2-3 days.   Return immediately back to the ER if:  Your symptoms worsen within the next 12-24 hours. You develop new symptoms such as new fevers, persistent vomiting, new pain, shortness of breath, or new weakness or numbness, or if you have any other concerns.

## 2020-11-18 NOTE — ED Triage Notes (Signed)
Pt presents with n/v for 2 days. Denies fever. Pt cant keep anything down.

## 2020-11-18 NOTE — ED Provider Notes (Signed)
[facility] EMERGENCY DEPT Provider Note   CSN: [id number] Arrival date & time: 11/18/20  1053     History Chief Complaint  Patient presents with   Nausea    Katie Martinez is a 60 y.o. female.  Patient presents chief complaint of nausea and vomiting.  Ongoing for 2 days.  Denies any abdominal pain.  She states she had some loose stools but no diarrhea.  Denies fevers denies cough.  She states her and her husband and family went out to eat 2 days ago but no one else is sick except for her.  Denies any other recent travel or recent antibiotic use.      Past Medical History:  Diagnosis Date   Hyperlipidemia    Hypertension     Patient Active Problem List   Diagnosis Date Noted   Anxiety and depression 09/19/2019   Tachyarrhythmia 09/19/2019   HTN (hypertension) 09/19/2019   HLD (hyperlipidemia) 09/19/2019    Past Surgical History:  Procedure Laterality Date   BREAST CYST ASPIRATION Left    20 years ago   CHOLECYSTECTOMY     LAPAROSCOPIC ENDOMETRIOSIS FULGURATION     TONSILLECTOMY AND ADENOIDECTOMY       OB History   No obstetric history on file.     Family History  Problem Relation Age of Onset   Lymphoma Mother    Hypertension Mother    Hyperlipidemia Mother    Arthritis Father    Hyperlipidemia Father    Hypertension Father    Stroke Father    Hyperlipidemia Brother    Hypertension Brother    Prostate cancer Brother    Skin cancer Brother    Arthritis Maternal Grandmother    Lymphoma Maternal Grandmother    Stroke Maternal Grandmother    Brain cancer Maternal Grandfather    Hypertension Maternal Grandfather    Hyperlipidemia Maternal Grandfather    Heart disease Maternal Grandfather    Heart disease Paternal Grandmother    Hyperlipidemia Paternal Grandmother    Hypertension Paternal Grandmother    Stroke Paternal Grandfather    Arthritis Paternal Grandfather    Hypertension Brother    Hyperlipidemia Brother    Prostate  cancer Brother    Skin cancer Brother    Breast cancer Maternal Aunt    Breast cancer Maternal Aunt     Social History   Tobacco Use   Smoking status: Never   Smokeless tobacco: Never  Substance Use Topics   Alcohol use: Yes    Comment: 2 glasses at night   Drug use: Never    Home Medications Prior to Admission medications   Medication Sig Start Date End Date Taking? Authorizing Provider  ondansetron (ZOFRAN ODT) 4 MG disintegrating tablet Take 1 tablet (4 mg total) by mouth every 8 (eight) hours as needed for nausea or vomiting. 11/18/20  Yes Cheryll Cockayne, MD  Cholecalciferol (VITAMIN D) 50 MCG (2000 UT) CAPS Take by mouth.    [provider]  estradiol (ESTRACE) 0.1 MG/GM vaginal cream Place 1 Applicatorful vaginally at bedtime.    [provider]  hydrochlorothiazide (HYDRODIURIL) 12.5 MG tablet Take 1 tablet (12.5 mg total) by mouth daily. 07/27/20   Eulis Foster, FNP  LORazepam (ATIVAN) 0.5 MG tablet Take 1 tablet (0.5 mg total) by mouth daily as needed for anxiety. 11/06/20   Lorre Munroe, NP  losartan (COZAAR) 50 MG tablet Take 1 tablet (50 mg total) by mouth daily. 07/26/20   Lorre Munroe, NP  Multiple Vitamin (MULTIVITAMIN) tablet Take 1 tablet by mouth daily.    [provider]  vitamin C (ASCORBIC ACID) 250 MG tablet Take 250 mg by mouth daily.    [provider]    Allergies    Lisinopril and Metoprolol  Review of Systems   Review of Systems  Constitutional:  Negative for fever.  HENT:  Negative for ear pain.   Eyes:  Negative for pain.  Respiratory:  Negative for cough.   Cardiovascular:  Negative for chest pain.  Gastrointestinal:  Negative for abdominal pain.  Genitourinary:  Negative for flank pain.  Musculoskeletal:  Negative for back pain.  Skin:  Negative for rash.  Neurological:  Negative for headaches.   Physical Exam Updated Vital Signs BP (!) 158/80 (BP Location: Right Arm)   Pulse 83   Temp 99 F (37.2  C) (Oral)   Resp 16   SpO2 98%   Physical Exam Constitutional:      General: She is not in acute distress.    Appearance: Normal appearance.  HENT:     Head: Normocephalic.     Nose: Nose normal.  Eyes:     Extraocular Movements: Extraocular movements intact.  Cardiovascular:     Rate and Rhythm: Normal rate.  Pulmonary:     Effort: Pulmonary effort is normal.  Abdominal:     General: There is no distension.     Tenderness: There is no abdominal tenderness. There is no guarding or rebound.  Musculoskeletal:        General: Normal range of motion.     Cervical back: Normal range of motion.  Neurological:     General: No focal deficit present.     Mental Status: She is alert. Mental status is at baseline.    ED Results / Procedures / Treatments   Labs (all labs ordered are listed, but only abnormal results are displayed) Labs Reviewed  CBC WITH DIFFERENTIAL/PLATELET  BASIC METABOLIC PANEL    EKG None  Radiology No results found.  Procedures Procedures   Medications Ordered in ED Medications  ondansetron (ZOFRAN-ODT) 4 MG disintegrating tablet (4 mg  Given 11/18/20 1128)  sodium chloride 0.9 % bolus 1,000 mL (1,000 mLs Intravenous New Bag/Given 11/18/20 1521)    ED Course  I have reviewed the triage vital signs and the nursing notes.  Pertinent labs & imaging results that were available during my care of the patient were reviewed by me and considered in my medical decision making (see chart for details).    MDM Rules/Calculators/A&P                           Patient given Zofran here with improvement of symptoms.  Vital signs within normal limits, clinically she is well-appearing comfortable nontender abdominal exam no guarding or rebound tenderness noted.  Labs are sent is unremarkable patient given a liter bolus of fluids.  Will be discharged home in stable condition advised follow-up with her doctor as needed this week.  Advising immediate return for  fevers worsening pain or inability to keep down any fluids.   Final Clinical Impression(s) / ED Diagnoses Final diagnoses:  Vomiting without nausea, intractability of vomiting not specified, unspecified vomiting type    Rx / DC Orders ED Discharge Orders          Ordered    ondansetron (ZOFRAN ODT) 4 MG disintegrating tablet  Every 8 hours PRN  11/18/20 1600             Cheryll Cockayne, MD 11/18/20 1601

## 2020-11-19 ENCOUNTER — Encounter: Payer: Self-pay | Admitting: Internal Medicine

## 2020-11-19 MED ORDER — HYDROCHLOROTHIAZIDE 12.5 MG PO TABS: 12.5000 mg | ORAL_TABLET | Freq: Every day | ORAL | 0 refills | Status: DC

## 2020-12-11 DIAGNOSIS — H43812 Vitreous degeneration, left eye: Secondary | ICD-10-CM | POA: Diagnosis not present

## 2021-01-02 ENCOUNTER — Other Ambulatory Visit: Payer: Self-pay | Admitting: Internal Medicine

## 2021-01-02 DIAGNOSIS — Z1231 Encounter for screening mammogram for malignant neoplasm of breast: Secondary | ICD-10-CM

## 2021-01-02 DIAGNOSIS — Z1211 Encounter for screening for malignant neoplasm of colon: Secondary | ICD-10-CM

## 2021-01-17 ENCOUNTER — Other Ambulatory Visit: Payer: Self-pay

## 2021-01-17 DIAGNOSIS — Z1211 Encounter for screening for malignant neoplasm of colon: Secondary | ICD-10-CM

## 2021-01-17 MED ORDER — NA SULFATE-K SULFATE-MG SULF 17.5-3.13-1.6 GM/177ML PO SOLN: 1.0000 | Freq: Once | ORAL | 0 refills | Status: AC

## 2021-01-17 NOTE — Progress Notes (Signed)
Gastroenterology Pre-Procedure Review  Request Date: 02/04/21 Requesting Physician: Dr. Tobi Bastos  PATIENT REVIEW QUESTIONS: The patient responded to the following health history questions as indicated:    1. Are you having any GI issues? no 2. Do you have a personal history of Polyps? no 3. Do you have a family history of Colon Cancer or Polyps? no 4. Diabetes Mellitus? no 5. Joint replacements in the past 12 months?no 6. Major health problems in the past 3 months?no 7. Any artificial heart valves, MVP, or defibrillator?no    MEDICATIONS & ALLERGIES:    Patient reports the following regarding taking any anticoagulation/antiplatelet therapy:   Plavix, Coumadin, Eliquis, Xarelto, Lovenox, Pradaxa, Brilinta, or Effient? no Aspirin? no  Patient confirms/reports the following medications:  Current Outpatient Medications  Medication Sig Dispense Refill   Cholecalciferol (VITAMIN D) 50 MCG (2000 UT) CAPS Take by mouth.     estradiol (ESTRACE) 0.1 MG/GM vaginal cream Place 1 Applicatorful vaginally at bedtime.     hydrochlorothiazide (HYDRODIURIL) 12.5 MG tablet Take 1 tablet (12.5 mg total) by mouth daily. 90 tablet 0   LORazepam (ATIVAN) 0.5 MG tablet Take 1 tablet (0.5 mg total) by mouth daily as needed for anxiety. 20 tablet 0   losartan (COZAAR) 50 MG tablet Take 1 tablet (50 mg total) by mouth daily. 90 tablet 0   Multiple Vitamin (MULTIVITAMIN) tablet Take 1 tablet by mouth daily.     ondansetron (ZOFRAN ODT) 4 MG disintegrating tablet Take 1 tablet (4 mg total) by mouth every 8 (eight) hours as needed for nausea or vomiting. 20 tablet 0   vitamin C (ASCORBIC ACID) 250 MG tablet Take 250 mg by mouth daily.     No current facility-administered medications for this visit.    Patient confirms/reports the following allergies:  Allergies  Allergen Reactions   Lisinopril Other (See Comments)    Dry cough     Metoprolol Other (See Comments)    Vivid dreams, dizziness    No orders of  the defined types were placed in this encounter.   AUTHORIZATION INFORMATION Primary Insurance: 1D#: Group #:  Secondary Insurance: 1D#: Group #:  SCHEDULE INFORMATION: Date: 02/04/21 Time: Location: [facility]

## 2021-01-22 DIAGNOSIS — H43812 Vitreous degeneration, left eye: Secondary | ICD-10-CM | POA: Diagnosis not present

## 2021-01-25 ENCOUNTER — Other Ambulatory Visit: Payer: Self-pay | Admitting: Internal Medicine

## 2021-01-25 NOTE — Telephone Encounter (Signed)
Requested medication (s) are due for refill today: yes  Requested medication (s) are on the active medication list: yes  Last refill:  07/26/20  Future visit scheduled: yes  Notes to clinic:  unable to determine when last seen   Requested Prescriptions  Pending Prescriptions Disp Refills   losartan (COZAAR) 50 MG tablet [Pharmacy Med Name: LOSARTAN POTASSIUM 50 MG TAB] 90 tablet 0    Sig: TAKE 1 TABLET BY MOUTH EVERY DAY     Cardiovascular:  Angiotensin Receptor Blockers Failed - 01/25/2021  7:06 AM      Failed - Last BP in normal range    BP Readings from Last 1 Encounters:  11/18/20 (!) 152/87          Failed - Valid encounter within last 6 months    Recent Outpatient Visits   None     Future Appointments             In 2 weeks Baity, Salvadore Oxford, NP [facility]            Passed - Cr in normal range and within 180 days    Creatinine, Ser  Date Value Ref Range Status  11/18/2020 0.57 0.44 - 1.00 mg/dL Final          Passed - K in normal range and within 180 days    Potassium  Date Value Ref Range Status  11/18/2020 3.8 3.5 - 5.1 mmol/L Final          Passed - Patient is not pregnant

## 2021-02-01 ENCOUNTER — Encounter: Payer: Self-pay | Admitting: Gastroenterology

## 2021-02-04 ENCOUNTER — Encounter
Admission: RE | Disposition: A | Discharge status: Home / Self Care | Payer: Self-pay | Source: Home / Self Care | Attending: Gastroenterology

## 2021-02-04 ENCOUNTER — Ambulatory Visit
Admission: RE | Admit: 2021-02-04 | Discharge: 2021-02-04 | Disposition: A | Discharge status: Home / Self Care | Payer: BC Managed Care – PPO | Attending: Gastroenterology | Admitting: Gastroenterology

## 2021-02-04 ENCOUNTER — Ambulatory Visit: Payer: BC Managed Care – PPO | Admitting: Certified Registered"

## 2021-02-04 DIAGNOSIS — E785 Hyperlipidemia, unspecified: Secondary | ICD-10-CM | POA: Insufficient documentation

## 2021-02-04 DIAGNOSIS — I10 Essential (primary) hypertension: Secondary | ICD-10-CM | POA: Diagnosis not present

## 2021-02-04 DIAGNOSIS — Z1211 Encounter for screening for malignant neoplasm of colon: Secondary | ICD-10-CM | POA: Diagnosis not present

## 2021-02-04 DIAGNOSIS — F419 Anxiety disorder, unspecified: Secondary | ICD-10-CM | POA: Diagnosis not present

## 2021-02-04 DIAGNOSIS — F32A Depression, unspecified: Secondary | ICD-10-CM | POA: Insufficient documentation

## 2021-02-04 HISTORY — PX: COLONOSCOPY WITH PROPOFOL: SHX5780

## 2021-02-04 SURGERY — COLONOSCOPY WITH PROPOFOL
Anesthesia: General

## 2021-02-04 MED ORDER — PROPOFOL 10 MG/ML IV BOLUS
INTRAVENOUS | Status: DC | PRN
  Administered 2021-02-04 (×2): 50 mg via INTRAVENOUS

## 2021-02-04 MED ORDER — LIDOCAINE HCL (PF) 2 % IJ SOLN
INTRAMUSCULAR | Status: AC
  Filled 2021-02-04: qty 5

## 2021-02-04 MED ORDER — MIDAZOLAM HCL 5 MG/5ML IJ SOLN
INTRAMUSCULAR | Status: DC | PRN
  Administered 2021-02-04: 2 mg via INTRAVENOUS

## 2021-02-04 MED ORDER — SODIUM CHLORIDE 0.9 % IV SOLN
INTRAVENOUS | Status: DC
  Administered 2021-02-04: 20 mL/h via INTRAVENOUS

## 2021-02-04 MED ORDER — PROPOFOL 10 MG/ML IV BOLUS
INTRAVENOUS | Status: AC
  Filled 2021-02-04: qty 20

## 2021-02-04 MED ORDER — LIDOCAINE 2% (20 MG/ML) 5 ML SYRINGE
INTRAMUSCULAR | Status: DC | PRN
  Administered 2021-02-04: 20 mg via INTRAVENOUS

## 2021-02-04 MED ORDER — MIDAZOLAM HCL 2 MG/2ML IJ SOLN
INTRAMUSCULAR | Status: AC
  Filled 2021-02-04: qty 2

## 2021-02-04 MED ORDER — PROPOFOL 500 MG/50ML IV EMUL
INTRAVENOUS | Status: DC | PRN
  Administered 2021-02-04: 120 ug/kg/min via INTRAVENOUS

## 2021-02-04 NOTE — Op Note (Signed)
[facility] Gastroenterology Patient Name: Katie Martinez Procedure Date: 02/04/2021 9:58 AM MRN: [id number] Account #: [id number] Date of Birth: Mar 19, 1960 Admit Type: Outpatient Age: 60 Room: [facility] ENDO ROOM 4 Gender: Female Note Status: Finalized Instrument Name: Prentice Docker [id number] Procedure:             Colonoscopy Indications:           Screening for colorectal malignant neoplasm Providers:             [name] MD, MD Referring MD:          Lorre Munroe (Referring MD) Medicines:             Monitored Anesthesia Care Complications:         No immediate complications. Procedure:             Pre-Anesthesia Assessment:                        - Prior to the procedure, a History and Physical was                         performed, and patient medications, allergies and                         sensitivities were reviewed. The patient's tolerance                         of previous anesthesia was reviewed.                        - The risks and benefits of the procedure and the                         sedation options and risks were discussed with the                         patient. All questions were answered and informed                         consent was obtained.                        - ASA Grade Assessment: II - A patient with mild                         systemic disease.                        After obtaining informed consent, the colonoscope was                         passed under direct vision. Throughout the procedure,                         the patient's blood pressure, pulse, and oxygen                         saturations were monitored continuously. The                         Colonoscope was introduced  through the anus and                         advanced to the the cecum, identified by the                         appendiceal orifice. The colonoscopy was performed                         with ease. The patient tolerated the procedure well.                          The quality of the bowel preparation was good. Findings:      The perianal and digital rectal examinations were normal.      The entire examined colon appeared normal on direct and retroflexion       views. Impression:            - The entire examined colon is normal on direct and                         retroflexion views.                        - No specimens collected. Recommendation:        - Discharge patient to home (with escort).                        - Resume previous diet.                        - Continue present medications.                        - Repeat colonoscopy in 10 years for screening                         purposes. Procedure Code(s):     --- Professional ---                        [phone], Colonoscopy, flexible; diagnostic, including                         collection of specimen(s) by brushing or washing, when                         performed (separate procedure) Diagnosis Code(s):     --- Professional ---                        Z12.11, Encounter for screening for malignant neoplasm                         of colon CPT copyright 2019 American Medical Association. All rights reserved. The codes documented in this report are preliminary and upon coder review may  be revised to meet current compliance requirements. [name], MD [name] MD, MD 02/04/2021 10:26:16 AM This report has been signed electronically. Number of Addenda: 0 Note Initiated On: 02/04/2021 9:58 AM Scope Withdrawal Time: 0 hours 9 minutes 45 seconds  Total Procedure Duration: 0 hours 15  minutes 26 seconds  Estimated Blood Loss:  Estimated blood loss: none.      [facility]

## 2021-02-04 NOTE — Transfer of Care (Signed)
Immediate Anesthesia Transfer of Care Note  Patient: Katie Martinez  Procedure(s) Performed: COLONOSCOPY WITH PROPOFOL  Patient Location: Endoscopy Unit  Anesthesia Type:General  Level of Consciousness: awake  Airway & Oxygen Therapy: Patient Spontanous Breathing  Post-op Assessment: Report given to RN and Post -op Vital signs reviewed and stable  Post vital signs: Reviewed  Last Vitals:  Vitals Value Taken Time  BP 102/58 02/04/21 1029  Temp    Pulse    Resp    SpO2    Vitals shown include unvalidated device data.  Last Pain:  Vitals:   02/04/21 0859  TempSrc: Temporal  PainSc: 0-No pain         Complications: No notable events documented.

## 2021-02-04 NOTE — Anesthesia Preprocedure Evaluation (Signed)
Anesthesia Evaluation  Patient identified by MRN, date of birth, ID band Patient awake    Reviewed: Allergy & Precautions, H&P , NPO status , Patient's Chart, lab work & pertinent test results, reviewed documented beta blocker date and time   Airway Mallampati: II   Neck ROM: full    Dental  (+) Poor Dentition   Pulmonary neg pulmonary ROS,    Pulmonary exam normal        Cardiovascular Exercise Tolerance: Good hypertension, On Medications negative cardio ROS Normal cardiovascular exam Rhythm:regular Rate:Normal     Neuro/Psych PSYCHIATRIC DISORDERS Anxiety Depression negative neurological ROS     GI/Hepatic negative GI ROS, Neg liver ROS,   Endo/Other  negative endocrine ROS  Renal/GU negative Renal ROS  negative genitourinary   Musculoskeletal   Abdominal   Peds  Hematology negative hematology ROS (+)   Anesthesia Other Findings Past Medical History: No date: Hyperlipidemia No date: Hypertension Past Surgical History: No date: BREAST CYST ASPIRATION; Left     Comment:  20 years ago No date: CHOLECYSTECTOMY No date: LAPAROSCOPIC ENDOMETRIOSIS FULGURATION No date: TONSILLECTOMY AND ADENOIDECTOMY BMI    Body Mass Index: 19.53 kg/m     Reproductive/Obstetrics negative OB ROS                             Anesthesia Physical Anesthesia Plan  ASA: 2  Anesthesia Plan: General   Post-op Pain Management:    Induction:   PONV Risk Score and Plan:   Airway Management Planned:   Additional Equipment:   Intra-op Plan:   Post-operative Plan:   Informed Consent: I have reviewed the patients History and Physical, chart, labs and discussed the procedure including the risks, benefits and alternatives for the proposed anesthesia with the patient or authorized representative who has indicated his/her understanding and acceptance.     Dental Advisory Given  Plan Discussed with:  CRNA  Anesthesia Plan Comments:         Anesthesia Quick Evaluation

## 2021-02-04 NOTE — Anesthesia Postprocedure Evaluation (Signed)
Anesthesia Post Note  Patient: Katie Martinez  Procedure(s) Performed: COLONOSCOPY WITH PROPOFOL  Patient location during evaluation: PACU Anesthesia Type: General Level of consciousness: awake and alert Pain management: pain level controlled Vital Signs Assessment: post-procedure vital signs reviewed and stable Respiratory status: spontaneous breathing, nonlabored ventilation, respiratory function stable and patient connected to nasal cannula oxygen Cardiovascular status: blood pressure returned to baseline and stable Postop Assessment: no apparent nausea or vomiting Anesthetic complications: no   No notable events documented.   Last Vitals:  Vitals:   02/04/21 1030 02/04/21 1040  BP: (!) 102/58 98/75  Pulse: 84 88  Resp: 16 19  Temp:    SpO2: 100% 100%    Last Pain:  Vitals:   02/04/21 1040  TempSrc:   PainSc: 0-No pain                 Yevette Edwards

## 2021-02-04 NOTE — H&P (Signed)
Katie Mood, MD [address], [place], [place], [address] [address], [address], [place], [place], [address] Phone: [phone]  Fax: [phone]  Primary Care Physician:  Lorre Munroe, NP   Pre-Procedure History & Physical: HPI:  Katie Martinez is a 60 y.o. female is here for an colonoscopy.   Past Medical History:  Diagnosis Date   Hyperlipidemia    Hypertension     Past Surgical History:  Procedure Laterality Date   BREAST CYST ASPIRATION Left    20 years ago   CHOLECYSTECTOMY     LAPAROSCOPIC ENDOMETRIOSIS FULGURATION     TONSILLECTOMY AND ADENOIDECTOMY      Prior to Admission medications   Medication Sig Start Date End Date Taking? Authorizing Provider  Cholecalciferol (VITAMIN D) 50 MCG (2000 UT) CAPS Take by mouth.   Yes [provider]  estradiol (ESTRACE) 0.1 MG/GM vaginal cream Place 1 Applicatorful vaginally at bedtime.   Yes [provider]  hydrochlorothiazide (HYDRODIURIL) 12.5 MG tablet Take 1 tablet (12.5 mg total) by mouth daily. 11/19/20  Yes Baity, Salvadore Oxford, NP  LORazepam (ATIVAN) 0.5 MG tablet Take 1 tablet (0.5 mg total) by mouth daily as needed for anxiety. 11/06/20  Yes Lorre Munroe, NP  losartan (COZAAR) 50 MG tablet TAKE 1 TABLET BY MOUTH EVERY DAY 01/28/21  Yes Lorre Munroe, NP  Multiple Vitamin (MULTIVITAMIN) tablet Take 1 tablet by mouth daily.   Yes [provider]  ondansetron (ZOFRAN ODT) 4 MG disintegrating tablet Take 1 tablet (4 mg total) by mouth every 8 (eight) hours as needed for nausea or vomiting. 11/18/20  Yes Cheryll Cockayne, MD  vitamin C (ASCORBIC ACID) 250 MG tablet Take 250 mg by mouth daily.   Yes [provider]    Allergies as of 01/17/2021 - Review Complete 11/18/2020  Allergen Reaction Noted   Lisinopril Other (See Comments) 09/16/2019   Metoprolol Other (See Comments) 09/16/2019    Family History  Problem Relation Age of Onset   Lymphoma Mother     Hypertension Mother    Hyperlipidemia Mother    Arthritis Father    Hyperlipidemia Father    Hypertension Father    Stroke Father    Hyperlipidemia Brother    Hypertension Brother    Prostate cancer Brother    Skin cancer Brother    Arthritis Maternal Grandmother    Lymphoma Maternal Grandmother    Stroke Maternal Grandmother    Brain cancer Maternal Grandfather    Hypertension Maternal Grandfather    Hyperlipidemia Maternal Grandfather    Heart disease Maternal Grandfather    Heart disease Paternal Grandmother    Hyperlipidemia Paternal Grandmother    Hypertension Paternal Grandmother    Stroke Paternal Grandfather    Arthritis Paternal Grandfather    Hypertension Brother    Hyperlipidemia Brother    Prostate cancer Brother    Skin cancer Brother    Breast cancer Maternal Aunt    Breast cancer Maternal Aunt     Social History   Socioeconomic History   Marital status: Married    Spouse name: Not on file   Number of children: Not on file   Years of education: Not on file   Highest education level: Not on file  Occupational History   Not on file  Tobacco Use   Smoking status: Never   Smokeless tobacco: Never  Substance and Sexual Activity   Alcohol use: Yes    Comment: 2 glasses  at night   Drug use: Never   Sexual activity: Not on file  Other Topics Concern   Not on file  Social History Narrative   Not on file   Social Determinants of Health   Financial Resource Strain: Not on file  Food Insecurity: Not on file  Transportation Needs: Not on file  Physical Activity: Not on file  Stress: Not on file  Social Connections: Not on file  Intimate Partner Violence: Not on file    Review of Systems: See HPI, otherwise negative ROS  Physical Exam: BP 133/89   Pulse 79   Temp (!) 97.2 F (36.2 C) (Temporal)   Resp 20   Ht 5' (1.524 m)   Wt 45.4 kg   SpO2 100%   BMI 19.53 kg/m  General:   Alert,  pleasant and cooperative in NAD Head:  Normocephalic and  atraumatic. Neck:  Supple; no masses or thyromegaly. Lungs:  Clear throughout to auscultation, normal respiratory effort.    Heart:  +S1, +S2, Regular rate and rhythm, No edema. Abdomen:  Soft, nontender and nondistended. Normal bowel sounds, without guarding, and without rebound.   Neurologic:  Alert and  oriented x4;  grossly normal neurologically.  Impression/Plan: [name] is here for an colonoscopy to be performed for Screening colonoscopy average risk   Risks, benefits, limitations, and alternatives regarding  colonoscopy have been reviewed with the patient.  Questions have been answered.  All parties agreeable.   Katie Mood, MD  02/04/2021, 9:54 AM

## 2021-02-05 ENCOUNTER — Encounter: Payer: Self-pay | Admitting: Gastroenterology

## 2021-02-05 ENCOUNTER — Ambulatory Visit
Admission: RE | Admit: 2021-02-05 | Discharge: 2021-02-05 | Disposition: A | Discharge status: Home / Self Care | Payer: BC Managed Care – PPO | Source: Ambulatory Visit

## 2021-02-05 DIAGNOSIS — Z1231 Encounter for screening mammogram for malignant neoplasm of breast: Secondary | ICD-10-CM | POA: Diagnosis not present

## 2021-02-13 ENCOUNTER — Ambulatory Visit (INDEPENDENT_AMBULATORY_CARE_PROVIDER_SITE_OTHER): Payer: BC Managed Care – PPO | Admitting: Internal Medicine

## 2021-02-13 ENCOUNTER — Encounter: Payer: Self-pay | Admitting: Internal Medicine

## 2021-02-13 ENCOUNTER — Other Ambulatory Visit: Payer: Self-pay

## 2021-02-13 VITALS — BP 121/69 | HR 71 | Temp 97.7°F | Resp 18 | Ht 60.0 in | Wt 104.8 lb

## 2021-02-13 DIAGNOSIS — I10 Essential (primary) hypertension: Secondary | ICD-10-CM | POA: Diagnosis not present

## 2021-02-13 DIAGNOSIS — F32A Depression, unspecified: Secondary | ICD-10-CM

## 2021-02-13 DIAGNOSIS — Z0001 Encounter for general adult medical examination with abnormal findings: Secondary | ICD-10-CM

## 2021-02-13 DIAGNOSIS — Z780 Asymptomatic menopausal state: Secondary | ICD-10-CM | POA: Diagnosis not present

## 2021-02-13 DIAGNOSIS — E7800 Pure hypercholesterolemia, unspecified: Secondary | ICD-10-CM | POA: Diagnosis not present

## 2021-02-13 DIAGNOSIS — F419 Anxiety disorder, unspecified: Secondary | ICD-10-CM | POA: Diagnosis not present

## 2021-02-13 MED ORDER — ESTRADIOL 0.1 MG/GM VA CREA: 1.0000 | TOPICAL_CREAM | Freq: Every day | VAGINAL | 0 refills | Status: DC

## 2021-02-13 MED ORDER — LOSARTAN POTASSIUM 50 MG PO TABS: 50.0000 mg | ORAL_TABLET | Freq: Every day | ORAL | 3 refills | Status: DC

## 2021-02-13 NOTE — Assessment & Plan Note (Signed)
Controlled on Losartan Reinforced DASH diet CMET today

## 2021-02-13 NOTE — Progress Notes (Signed)
Subjective:    Patient ID: Katie Martinez, female    DOB: [birth year], 60 y.o.   MRN: [id number]  HPI  Pt presents to the clinic today for her annual exam. She is also due to follow up chronic conditions.  Anxiety and Depression: Managed with Lorazepam as needed. She is not currently seeing a therapist. She denies SI/HI.  HLD: Her last LDL was 127, triglycerides 150, 02/2020. She is not taking any cholesterol lowering medication at this time. She tries to consume a low fat diet.  HTN: Her BP today is 121/69. She is taking Losartan but is not taking HCTZ because she feels like ti is causing muscle cramps and too much weight loss. There is no ECG on file.  Flu: 12/2020 Tetanus: 05/2011 Covid: Pfizer x 2 Shingrix: 05/2019, 06/2019 Pap smear: 4 years ago Mammogram: 01/2021 Bone density: never Colon screening: 01/2021 Vision screening: annually Dentist: biannually  Diet: She does eat meat. She consumes fruits and veggies. She tries to avoid fried foods. She drinks mostly water and tea. Exercise: None  Review of Systems  Past Medical History:  Diagnosis Date   Hyperlipidemia    Hypertension     Current Outpatient Medications  Medication Sig Dispense Refill   Cholecalciferol (VITAMIN D) 50 MCG (2000 UT) CAPS Take by mouth.     hydrochlorothiazide (HYDRODIURIL) 12.5 MG tablet Take 1 tablet (12.5 mg total) by mouth daily. 90 tablet 0   LORazepam (ATIVAN) 0.5 MG tablet Take 1 tablet (0.5 mg total) by mouth daily as needed for anxiety. 20 tablet 0   losartan (COZAAR) 50 MG tablet TAKE 1 TABLET BY MOUTH EVERY DAY 90 tablet 0   Multiple Vitamin (MULTIVITAMIN) tablet Take 1 tablet by mouth daily.     ondansetron (ZOFRAN ODT) 4 MG disintegrating tablet Take 1 tablet (4 mg total) by mouth every 8 (eight) hours as needed for nausea or vomiting. 20 tablet 0   vitamin C (ASCORBIC ACID) 250 MG tablet Take 250 mg by mouth daily.     estradiol (ESTRACE) 0.1 MG/GM vaginal cream Place 1  Applicatorful vaginally at bedtime. (Patient not taking: Reported on 02/13/2021)     No current facility-administered medications for this visit.    Allergies  Allergen Reactions   Lisinopril Other (See Comments)    Dry cough     Metoprolol Other (See Comments)    Vivid dreams, dizziness    Family History  Problem Relation Age of Onset   Lymphoma Mother    Hypertension Mother    Hyperlipidemia Mother    Arthritis Father    Hyperlipidemia Father    Hypertension Father    Stroke Father    Hyperlipidemia Brother    Hypertension Brother    Prostate cancer Brother    Skin cancer Brother    Arthritis Maternal Grandmother    Lymphoma Maternal Grandmother    Stroke Maternal Grandmother    Brain cancer Maternal Grandfather    Hypertension Maternal Grandfather    Hyperlipidemia Maternal Grandfather    Heart disease Maternal Grandfather    Heart disease Paternal Grandmother    Hyperlipidemia Paternal Grandmother    Hypertension Paternal Grandmother    Stroke Paternal Grandfather    Arthritis Paternal Grandfather    Hypertension Brother    Hyperlipidemia Brother    Prostate cancer Brother    Skin cancer Brother    Breast cancer Maternal Aunt    Breast cancer Maternal Aunt     Social History   Socioeconomic History  Marital status: Married    Spouse name: Not on file   Number of children: Not on file   Years of education: Not on file   Highest education level: Not on file  Occupational History   Not on file  Tobacco Use   Smoking status: Never   Smokeless tobacco: Never  Vaping Use   Vaping Use: Never used  Substance and Sexual Activity   Alcohol use: Yes    Comment: 2 glasses at night   Drug use: Never   Sexual activity: Not on file  Other Topics Concern   Not on file  Social History Narrative   Not on file   Social Determinants of Health   Financial Resource Strain: Not on file  Food Insecurity: Not on file  Transportation Needs: Not on file   Physical Activity: Not on file  Stress: Not on file  Social Connections: Not on file  Intimate Partner Violence: Not on file     Constitutional: Denies fever, malaise, fatigue, headache or abrupt weight changes.  HEENT: Denies eye pain, eye redness, ear pain, ringing in the ears, wax buildup, runny nose, nasal congestion, bloody nose, or sore throat. Respiratory: Denies difficulty breathing, shortness of breath, cough or sputum production.   Cardiovascular: Denies chest pain, chest tightness, palpitations or swelling in the hands or feet.  Gastrointestinal: Denies abdominal pain, bloating, constipation, diarrhea or blood in the stool.  GU: Pt reports vaginal dryness. Denies urgency, frequency, pain with urination, burning sensation, blood in urine, odor or discharge. Musculoskeletal: Pt reports muscle cramps. Denies decrease in range of motion, difficulty with gait, or joint pain and swelling.  Skin: Denies redness, rashes, lesions or ulcercations.  Neurological: Denies dizziness, difficulty with memory, difficulty with speech or problems with balance and coordination.  Psych: Pt has a history of anxiety and depression. Denies SI/HI.  No other specific complaints in a complete review of systems (except as listed in HPI above).     Objective:   Physical Exam  BP 121/69 (BP Location: Right Arm, Patient Position: Sitting, Cuff Size: Normal)    Pulse 71    Temp 97.7 F (36.5 C) (Temporal)    Resp 18    Ht 5' (1.524 m)    Wt 104 lb 12.8 oz (47.5 kg)    SpO2 100%    BMI 20.47 kg/m  Wt Readings from Last 3 Encounters:  02/13/21 104 lb 12.8 oz (47.5 kg)  02/04/21 100 lb (45.4 kg)  02/08/20 105 lb (47.6 kg)    General: Appears her stated age, well developed, well nourished in NAD. Skin: Warm, dry and intact. Seborrheic keratosis noted of back. HEENT: Head: normal shape and size; Eyes: sclera white and EOMs intact;  Neck:  Neck supple, trachea midline. No masses, lumps or thyromegaly  present.  Cardiovascular: Normal rate and rhythm. S1,S2 noted.  No murmur, rubs or gallops noted. No JVD or BLE edema. No carotid bruits noted. Pulmonary/Chest: Normal effort and positive vesicular breath sounds. No respiratory distress. No wheezes, rales or ronchi noted.  Abdomen: Soft and nontender. Normal bowel sounds. No distention or masses noted. Liver, spleen and kidneys non palpable. Musculoskeletal: Strength 5/5 BUE/BLE. No difficulty with gait.  Neurological: Alert and oriented. Cranial nerves II-XII grossly intact. Coordination normal.  Psychiatric: Mood and affect normal. Behavior is normal. Judgment and thought content normal.    BMET    Component Value Date/Time   NA 138 11/18/2020 1523   K 3.8 11/18/2020 1523   CL 100  11/18/2020 1523   CO2 25 11/18/2020 1523   GLUCOSE 89 11/18/2020 1523   BUN 12 11/18/2020 1523   CREATININE 0.57 11/18/2020 1523   CALCIUM 9.7 11/18/2020 1523   GFRNONAA >60 11/18/2020 1523    Lipid Panel     Component Value Date/Time   CHOL 218 (H) 02/08/2020 1541   TRIG 150.0 (H) 02/08/2020 1541   HDL 61.50 02/08/2020 1541   CHOLHDL 4 02/08/2020 1541   VLDL 30.0 02/08/2020 1541   LDLCALC 127 (H) 02/08/2020 1541    CBC    Component Value Date/Time   WBC 8.5 11/18/2020 1523   RBC 4.04 11/18/2020 1523   HGB 13.3 11/18/2020 1523   HCT 38.6 11/18/2020 1523   PLT 231 11/18/2020 1523   MCV 95.5 11/18/2020 1523   MCH 32.9 11/18/2020 1523   MCHC 34.5 11/18/2020 1523   RDW 11.9 11/18/2020 1523   LYMPHSABS 1.2 11/18/2020 1523   MONOABS 0.5 11/18/2020 1523   EOSABS 0.0 11/18/2020 1523   BASOSABS 0.0 11/18/2020 1523    Hgb A1C No results found for: HGBA1C          Assessment & Plan:   Preventative Health Maintenance:  Flu shot UTD Tetanus UTD Shingrix UTD Encouraged her to get her covid booster Pap smear due 2024 Mammogram UTD Bone density ordered- she will schedule with her mammogram 01/2022 Colon screening UTD Encouraged her  to consume a balanced diet and exercise regimen Advised her to see an eye doctor and dentist annually Will check CBC, CMET and lipid profile today  RTC in 1 year, sooner if needed  Nicki Reaper, NP This visit occurred during the SARS-CoV-2 public health emergency.  Safety protocols were in place, including screening questions prior to the visit, additional usage of staff PPE, and extensive cleaning of exam room while observing appropriate contact time as indicated for disinfecting solutions.

## 2021-02-13 NOTE — Assessment & Plan Note (Signed)
Continue Lorazepam prn Support offered

## 2021-02-13 NOTE — Patient Instructions (Signed)
Health Maintenance for Postmenopausal Women ?Menopause is a normal process in which your ability to get pregnant comes to an end. This process happens slowly over many months or years, usually between the ages of 48 and 55. Menopause is complete when you have missed your menstrual period for 12 months. ?It is important to talk with your health care provider about some of the most common conditions that affect women after menopause (postmenopausal women). These include heart disease, cancer, and bone loss (osteoporosis). Adopting a healthy lifestyle and getting preventive care can help to promote your health and wellness. The actions you take can also lower your chances of developing some of these common conditions. ?What are the signs and symptoms of menopause? ?During menopause, you may have the following symptoms: ?Hot flashes. These can be moderate or severe. ?Night sweats. ?Decrease in sex drive. ?Mood swings. ?Headaches. ?Tiredness (fatigue). ?Irritability. ?Memory problems. ?Problems falling asleep or staying asleep. ?Talk with your health care provider about treatment options for your symptoms. ?Do I need hormone replacement therapy? ?Hormone replacement therapy is effective in treating symptoms that are caused by menopause, such as hot flashes and night sweats. ?Hormone replacement carries certain risks, especially as you become older. If you are thinking about using estrogen or estrogen with progestin, discuss the benefits and risks with your health care provider. ?How can I reduce my risk for heart disease and stroke? ?The risk of heart disease, heart attack, and stroke increases as you age. One of the causes may be a change in the body's hormones during menopause. This can affect how your body uses dietary fats, triglycerides, and cholesterol. Heart attack and stroke are medical emergencies. There are many things that you can do to help prevent heart disease and stroke. ?Watch your blood pressure ?High  blood pressure causes heart disease and increases the risk of stroke. This is more likely to develop in people who have high blood pressure readings or are overweight. ?Have your blood pressure checked: ?Every 3-5 years if you are 18-39 years of age. ?Every year if you are 40 years old or older. ?Eat a healthy diet ? ?Eat a diet that includes plenty of vegetables, fruits, low-fat dairy products, and lean protein. ?Do not eat a lot of foods that are high in solid fats, added sugars, or sodium. ?Get regular exercise ?Get regular exercise. This is one of the most important things you can do for your health. Most adults should: ?Try to exercise for at least 150 minutes each week. The exercise should increase your heart rate and make you sweat (moderate-intensity exercise). ?Try to do strengthening exercises at least twice each week. Do these in addition to the moderate-intensity exercise. ?Spend less time sitting. Even light physical activity can be beneficial. ?Other tips ?Work with your health care provider to achieve or maintain a healthy weight. ?Do not use any products that contain nicotine or tobacco. These products include cigarettes, chewing tobacco, and vaping devices, such as e-cigarettes. If you need help quitting, ask your health care provider. ?Know your numbers. Ask your health care provider to check your cholesterol and your blood sugar (glucose). Continue to have your blood tested as directed by your health care provider. ?Do I need screening for cancer? ?Depending on your health history and family history, you may need to have cancer screenings at different stages of your life. This may include screening for: ?Breast cancer. ?Cervical cancer. ?Lung cancer. ?Colorectal cancer. ?What is my risk for osteoporosis? ?After menopause, you may be   at increased risk for osteoporosis. Osteoporosis is a condition in which bone destruction happens more quickly than new bone creation. To help prevent osteoporosis or  the bone fractures that can happen because of osteoporosis, you may take the following actions: ?If you are 19-50 years old, get at least 1,000 mg of calcium and at least 600 international units (IU) of vitamin D per day. ?If you are older than age 50 but younger than age 70, get at least 1,200 mg of calcium and at least 600 international units (IU) of vitamin D per day. ?If you are older than age 70, get at least 1,200 mg of calcium and at least 800 international units (IU) of vitamin D per day. ?Smoking and drinking excessive alcohol increase the risk of osteoporosis. Eat foods that are rich in calcium and vitamin D, and do weight-bearing exercises several times each week as directed by your health care provider. ?How does menopause affect my mental health? ?Depression may occur at any age, but it is more common as you become older. Common symptoms of depression include: ?Feeling depressed. ?Changes in sleep patterns. ?Changes in appetite or eating patterns. ?Feeling an overall lack of motivation or enjoyment of activities that you previously enjoyed. ?Frequent crying spells. ?Talk with your health care provider if you think that you are experiencing any of these symptoms. ?General instructions ?See your health care provider for regular wellness exams and vaccines. This may include: ?Scheduling regular health, dental, and eye exams. ?Getting and maintaining your vaccines. These include: ?Influenza vaccine. Get this vaccine each year before the flu season begins. ?Pneumonia vaccine. ?Shingles vaccine. ?Tetanus, diphtheria, and pertussis (Tdap) booster vaccine. ?Your health care provider may also recommend other immunizations. ?Tell your health care provider if you have ever been abused or do not feel safe at home. ?Summary ?Menopause is a normal process in which your ability to get pregnant comes to an end. ?This condition causes hot flashes, night sweats, decreased interest in sex, mood swings, headaches, or lack  of sleep. ?Treatment for this condition may include hormone replacement therapy. ?Take actions to keep yourself healthy, including exercising regularly, eating a healthy diet, watching your weight, and checking your blood pressure and blood sugar levels. ?Get screened for cancer and depression. Make sure that you are up to date with all your vaccines. ?This information is not intended to replace advice given to you by your health care provider. Make sure you discuss any questions you have with your health care provider. ?Document Revised: 07/09/2020 Document Reviewed: 07/09/2020 ?Elsevier Patient Education ? 2022 [employer] ? ?

## 2021-02-13 NOTE — Assessment & Plan Note (Signed)
CMET and lipid profile today Encouraged her to consume a low fat diet 

## 2021-02-14 ENCOUNTER — Encounter: Payer: Self-pay | Admitting: Internal Medicine

## 2021-02-14 LAB — COMPLETE METABOLIC PANEL WITH GFR
AG Ratio: 1.8 (calc) (ref 1.0–2.5)
ALT: 22 U/L (ref 6–29)
AST: 22 U/L (ref 10–35)
Albumin: 4.6 g/dL (ref 3.6–5.1)
Alkaline phosphatase (APISO): 38 U/L (ref 37–153)
BUN: 10 mg/dL (ref 7–25)
CO2: 29 mmol/L (ref 20–32)
Calcium: 9.4 mg/dL (ref 8.6–10.4)
Chloride: 100 mmol/L (ref 98–110)
Creat: 0.65 mg/dL (ref 0.50–1.05)
Globulin: 2.6 g/dL (calc) (ref 1.9–3.7)
Glucose, Bld: 81 mg/dL (ref 65–99)
Potassium: 3.7 mmol/L (ref 3.5–5.3)
Sodium: 139 mmol/L (ref 135–146)
Total Bilirubin: 0.8 mg/dL (ref 0.2–1.2)
Total Protein: 7.2 g/dL (ref 6.1–8.1)
eGFR: 100 mL/min/{1.73_m2} (ref >=60)

## 2021-02-14 LAB — LIPID PANEL
Cholesterol: 244 mg/dL — ABNORMAL HIGH (ref <200)
HDL: 67 mg/dL (ref >=50)
LDL Cholesterol (Calc): 143 mg/dL (calc) — ABNORMAL HIGH
Non-HDL Cholesterol (Calc): 177 mg/dL (calc) — ABNORMAL HIGH (ref <130)
Total CHOL/HDL Ratio: 3.6 (calc) (ref <5.0)
Triglycerides: 199 mg/dL — ABNORMAL HIGH (ref <150)

## 2021-02-14 LAB — CBC
HCT: 39.8 % (ref 35.0–45.0)
Hemoglobin: 13.6 g/dL (ref 11.7–15.5)
MCH: 33.4 pg — ABNORMAL HIGH (ref 27.0–33.0)
MCHC: 34.2 g/dL (ref 32.0–36.0)
MCV: 97.8 fL (ref 80.0–100.0)
MPV: 9.9 fL (ref 7.5–12.5)
Platelets: 230 10*3/uL (ref 140–400)
RBC: 4.07 10*6/uL (ref 3.80–5.10)
RDW: 11.8 % (ref 11.0–15.0)
WBC: 7.5 10*3/uL (ref 3.8–10.8)

## 2021-02-16 ENCOUNTER — Other Ambulatory Visit: Payer: Self-pay | Admitting: Internal Medicine

## 2021-02-17 NOTE — Telephone Encounter (Signed)
dc'd 02/13/21 Nicki Reaper NP  Requested Prescriptions  Refused Prescriptions Disp Refills   hydrochlorothiazide (HYDRODIURIL) 12.5 MG tablet [Pharmacy Med Name: HYDROCHLOROTHIAZIDE 12.5 MG TB] 90 tablet 0    Sig: TAKE 1 TABLET BY MOUTH EVERY DAY     Cardiovascular: Diuretics - Thiazide Passed - 02/16/2021  9:23 AM      Passed - Ca in normal range and within 360 days    Calcium  Date Value Ref Range Status  02/13/2021 9.4 8.6 - 10.4 mg/dL Final         Passed - Cr in normal range and within 360 days    Creat  Date Value Ref Range Status  02/13/2021 0.65 0.50 - 1.05 mg/dL Final         Passed - K in normal range and within 360 days    Potassium  Date Value Ref Range Status  02/13/2021 3.7 3.5 - 5.3 mmol/L Final         Passed - Na in normal range and within 360 days    Sodium  Date Value Ref Range Status  02/13/2021 139 135 - 146 mmol/L Final         Passed - Last BP in normal range    BP Readings from Last 1 Encounters:  02/13/21 121/69         Passed - Valid encounter within last 6 months    Recent Outpatient Visits          4 days ago Encounter for general adult medical examination with abnormal findings   [facility] [place], Salvadore Oxford, NP

## 2021-03-21 ENCOUNTER — Encounter: Payer: Self-pay | Admitting: Internal Medicine

## 2021-04-08 ENCOUNTER — Encounter: Payer: Self-pay | Admitting: Internal Medicine

## 2021-04-08 ENCOUNTER — Ambulatory Visit (INDEPENDENT_AMBULATORY_CARE_PROVIDER_SITE_OTHER): Payer: BC Managed Care – PPO | Admitting: Internal Medicine

## 2021-04-08 ENCOUNTER — Other Ambulatory Visit: Payer: Self-pay

## 2021-04-08 VITALS — BP 151/75 | HR 77 | Temp 97.7°F | Wt 104.0 lb

## 2021-04-08 DIAGNOSIS — I10 Essential (primary) hypertension: Secondary | ICD-10-CM | POA: Diagnosis not present

## 2021-04-08 DIAGNOSIS — R002 Palpitations: Secondary | ICD-10-CM | POA: Diagnosis not present

## 2021-04-08 DIAGNOSIS — E782 Mixed hyperlipidemia: Secondary | ICD-10-CM

## 2021-04-08 DIAGNOSIS — R0789 Other chest pain: Secondary | ICD-10-CM | POA: Diagnosis not present

## 2021-04-08 MED ORDER — AMLODIPINE BESYLATE 10 MG PO TABS: 10.0000 mg | ORAL_TABLET | Freq: Every day | ORAL | 0 refills | Status: DC

## 2021-04-08 NOTE — Patient Instructions (Signed)

## 2021-04-08 NOTE — Assessment & Plan Note (Signed)
She did not do well with Losartan increase, will d/c Will trial Amlodipine 10 mg daily Reinforced DASH diet  Update me in 2 weeks with BP readings via mychart

## 2021-04-08 NOTE — Progress Notes (Signed)
Subjective:    Patient ID: Katie Martinez, female    DOB: 1960/07/04, 61 y.o.   MRN: [id number]  HPI  Patient presents to clinic today for follow-up of HTN.  She has noticed that over the last month her BP has been 150-170's systolic at home. She is taking Losartan as prescribed. She stopped taking HCTZ 1 month ago because she felt like she was losing too much weight. She did try to double up on her Losartan but reports this caused a severe headache. Her BP today is 151/75.  She has had some intermittent chest tightness and palpitations. She reports this can occur at rest or with exertion. It does not worsen with exertion. She reports associated shortness of breath, sweating, nausea, dizziness, or vision changes. She does have a history of anxiety but does not feel like she is anxious when she is having the chest tightness. She denies s/s of GERD. There is no ECG on file.  She also needs to follow up on HLD. Her most recent LDL was 143, triglycerides 199, 01/2022. She is not taking any cholesterol lowering medication. She has been trying to consume a low fat diet. She would like to know if she could take Grape Seed Oil  Review of Systems     Past Medical History:  Diagnosis Date   Hyperlipidemia    Hypertension     Current Outpatient Medications  Medication Sig Dispense Refill   Cholecalciferol (VITAMIN D) 50 MCG (2000 UT) CAPS Take by mouth.     estradiol (ESTRACE) 0.1 MG/GM vaginal cream Place 1 Applicatorful vaginally at bedtime. 42.5 g 0   LORazepam (ATIVAN) 0.5 MG tablet Take 1 tablet (0.5 mg total) by mouth daily as needed for anxiety. 20 tablet 0   losartan (COZAAR) 50 MG tablet Take 1 tablet (50 mg total) by mouth daily. 90 tablet 3   Multiple Vitamin (MULTIVITAMIN) tablet Take 1 tablet by mouth daily.     ondansetron (ZOFRAN ODT) 4 MG disintegrating tablet Take 1 tablet (4 mg total) by mouth every 8 (eight) hours as needed for nausea or vomiting. 20 tablet 0   vitamin C  (ASCORBIC ACID) 250 MG tablet Take 250 mg by mouth daily.     No current facility-administered medications for this visit.    Allergies  Allergen Reactions   Lisinopril Other (See Comments)    Dry cough     Metoprolol Other (See Comments)    Vivid dreams, dizziness    Family History  Problem Relation Age of Onset   Lymphoma Mother    Hypertension Mother    Hyperlipidemia Mother    Arthritis Father    Hyperlipidemia Father    Hypertension Father    Stroke Father    Hyperlipidemia Brother    Hypertension Brother    Prostate cancer Brother    Skin cancer Brother    Arthritis Maternal Grandmother    Lymphoma Maternal Grandmother    Stroke Maternal Grandmother    Brain cancer Maternal Grandfather    Hypertension Maternal Grandfather    Hyperlipidemia Maternal Grandfather    Heart disease Maternal Grandfather    Heart disease Paternal Grandmother    Hyperlipidemia Paternal Grandmother    Hypertension Paternal Grandmother    Stroke Paternal Grandfather    Arthritis Paternal Grandfather    Hypertension Brother    Hyperlipidemia Brother    Prostate cancer Brother    Skin cancer Brother    Breast cancer Maternal Aunt    Breast cancer  Maternal Aunt     Social History   Socioeconomic History   Marital status: Married    Spouse name: Not on file   Number of children: Not on file   Years of education: Not on file   Highest education level: Not on file  Occupational History   Not on file  Tobacco Use   Smoking status: Never   Smokeless tobacco: Never  Vaping Use   Vaping Use: Never used  Substance and Sexual Activity   Alcohol use: Yes    Comment: 2 glasses at night   Drug use: Never   Sexual activity: Not on file  Other Topics Concern   Not on file  Social History Narrative   Not on file   Social Determinants of Health   Financial Resource Strain: Not on file  Food Insecurity: Not on file  Transportation Needs: Not on file  Physical Activity: Not on  file  Stress: Not on file  Social Connections: Not on file  Intimate Partner Violence: Not on file     Constitutional: Denies fever, malaise, fatigue, headache or abrupt weight changes.  Respiratory: Denies difficulty breathing, shortness of breath, cough or sputum production.   Cardiovascular: Pt reports intermittent chest tightness, palpitations. Denies chest pain, or swelling in the hands or feet.  Gastrointestinal: Denies abdominal pain, bloating, constipation, diarrhea or blood in the stool.  Musculoskeletal: Denies decrease in range of motion, difficulty with gait, muscle pain or joint pain and swelling.  Neurological: Denies dizziness, difficulty with memory, difficulty with speech or problems with balance and coordination.  Psych: Pt has a history of anxiety. Denies depression, SI/HI.  No other specific complaints in a complete review of systems (except as listed in HPI above).  Objective:   Physical Exam  BP (!) 151/75 (BP Location: Right Arm, Patient Position: Sitting, Cuff Size: Normal)    Pulse 77    Temp 97.7 F (36.5 C) (Temporal)    Wt 104 lb (47.2 kg)    SpO2 100%    BMI 20.31 kg/m   Wt Readings from Last 3 Encounters:  02/13/21 104 lb 12.8 oz (47.5 kg)  02/04/21 100 lb (45.4 kg)  02/08/20 105 lb (47.6 kg)    General: Appears her stated age, well developed, well nourished in NAD. HEENT: Head: normal shape and size; Eyes: sclera white and EOMs intact;  Cardiovascular: Normal rate and rhythm. S1,S2 noted.  No murmur, rubs or gallops noted. No JVD or BLE edema. No carotid bruits noted. Pulmonary/Chest: Normal effort and positive vesicular breath sounds. No respiratory distress. No wheezes, rales or ronchi noted.  Musculoskeletal: No difficulty with gait.  Neurological: Alert and oriented.  Psychiatric: Mood and affect normal. Behavior is normal. Judgment and thought content normal.    BMET    Component Value Date/Time   NA 139 02/13/2021 0822   K 3.7 02/13/2021  0822   CL 100 02/13/2021 0822   CO2 29 02/13/2021 0822   GLUCOSE 81 02/13/2021 0822   BUN 10 02/13/2021 0822   CREATININE 0.65 02/13/2021 0822   CALCIUM 9.4 02/13/2021 0822   GFRNONAA >60 11/18/2020 1523    Lipid Panel     Component Value Date/Time   CHOL 244 (H) 02/13/2021 0822   TRIG 199 (H) 02/13/2021 0822   HDL 67 02/13/2021 0822   CHOLHDL 3.6 02/13/2021 0822   VLDL 30.0 02/08/2020 1541   LDLCALC 143 (H) 02/13/2021 0822    CBC    Component Value Date/Time   WBC  7.5 02/13/2021 0822   RBC 4.07 02/13/2021 0822   HGB 13.6 02/13/2021 0822   HCT 39.8 02/13/2021 0822   PLT 230 02/13/2021 0822   MCV 97.8 02/13/2021 0822   MCH 33.4 (H) 02/13/2021 0822   MCHC 34.2 02/13/2021 0822   RDW 11.8 02/13/2021 0822   LYMPHSABS 1.2 11/18/2020 1523   MONOABS 0.5 11/18/2020 1523   EOSABS 0.0 11/18/2020 1523   BASOSABS 0.0 11/18/2020 1523    Hgb A1C No results found for: HGBA1C          Assessment & Plan:   Chest Tightness, Palpitations:  Indication for ECG: chest tightness and palpitations Interpretation of ECG: Normal rate and rhythm, no acute findings Comparison of ECG: None Could consider referral to cardiology for stress test if symptoms persist  Update me in 2 weeks with BP readings via mychart Nicki Reaper, NP This visit occurred during the SARS-CoV-2 public health emergency.  Safety protocols were in place, including screening questions prior to the visit, additional usage of staff PPE, and extensive cleaning of exam room while observing appropriate contact time as indicated for disinfecting solutions.

## 2021-04-08 NOTE — Assessment & Plan Note (Signed)
She would like to try Grape Seed Oil and low fat diet prior to starting statin medication Will recheck lipids in 2 months.

## 2021-04-22 ENCOUNTER — Encounter: Payer: Self-pay | Admitting: Internal Medicine

## 2021-04-22 MED ORDER — OLMESARTAN MEDOXOMIL 20 MG PO TABS: 20.0000 mg | ORAL_TABLET | Freq: Every day | ORAL | 0 refills | Status: DC

## 2021-04-24 ENCOUNTER — Encounter: Payer: Self-pay | Admitting: Internal Medicine

## 2021-04-30 ENCOUNTER — Other Ambulatory Visit: Payer: Self-pay | Admitting: Internal Medicine

## 2021-05-01 NOTE — Telephone Encounter (Signed)
Requested Prescriptions  ?Pending Prescriptions Disp Refills  ?? amLODipine (NORVASC) 10 MG tablet [Pharmacy Med Name: AMLODIPINE BESYLATE 10 MG TAB] 30 tablet 0  ?  Sig: TAKE 1 TABLET BY MOUTH EVERY DAY  ?  ? Cardiovascular: Calcium Channel Blockers 2 Failed - 04/30/2021  1:30 PM  ?  ?  Failed - Last BP in normal range  ?  BP Readings from Last 1 Encounters:  ?04/08/21 (!) 151/75  ?   ?  ?  Passed - Last Heart Rate in normal range  ?  Pulse Readings from Last 1 Encounters:  ?04/08/21 77  ?   ?  ?  Passed - Valid encounter within last 6 months  ?  Recent Outpatient Visits   ?      ? 3 weeks ago Chest tightness  ? [facility] [place], Salvadore Oxford, NP  ? 2 months ago Encounter for general adult medical examination with abnormal findings  ? [facility] [place], Salvadore Oxford, NP  ?  ?  ? ?  ?  ?  ? ?

## 2021-05-14 ENCOUNTER — Other Ambulatory Visit: Payer: Self-pay

## 2021-05-14 ENCOUNTER — Ambulatory Visit
Admission: RE | Admit: 2021-05-14 | Discharge: 2021-05-14 | Disposition: A | Discharge status: Home / Self Care | Payer: BC Managed Care – PPO | Attending: Internal Medicine | Admitting: Internal Medicine

## 2021-05-14 ENCOUNTER — Encounter: Payer: Self-pay | Admitting: Internal Medicine

## 2021-05-14 ENCOUNTER — Ambulatory Visit
Admission: RE | Admit: 2021-05-14 | Discharge: 2021-05-14 | Disposition: A | Discharge status: Home / Self Care | Payer: BC Managed Care – PPO | Source: Ambulatory Visit | Attending: Internal Medicine | Admitting: Internal Medicine

## 2021-05-14 ENCOUNTER — Ambulatory Visit (INDEPENDENT_AMBULATORY_CARE_PROVIDER_SITE_OTHER): Payer: BC Managed Care – PPO | Admitting: Internal Medicine

## 2021-05-14 VITALS — BP 126/73 | HR 74 | Temp 97.3°F | Wt 105.0 lb

## 2021-05-14 DIAGNOSIS — M25572 Pain in left ankle and joints of left foot: Secondary | ICD-10-CM | POA: Diagnosis not present

## 2021-05-14 DIAGNOSIS — M25472 Effusion, left ankle: Secondary | ICD-10-CM | POA: Diagnosis not present

## 2021-05-14 MED ORDER — OLMESARTAN MEDOXOMIL 20 MG PO TABS: 20.0000 mg | ORAL_TABLET | Freq: Every day | ORAL | 1 refills | Status: DC

## 2021-05-14 NOTE — Patient Instructions (Signed)
RICE Therapy for Routine Care of Injuries Many injuries can be cared for with rest, ice, compression, and elevation (RICE therapy). This includes: Resting the injured body part. Putting ice on the injury. Putting pressure (compression) on the injury. Raising the injured part (elevation). Using RICE therapy can help to lessen pain and swelling. Supplies needed: Ice. Plastic bag. Towel. Elastic bandage. Pillow or pillows to raise your injured body part. How to care for your injury with RICE therapy Rest Try to rest the injured part of your body. You can go back to your normal activities when your doctor says it is okay to do them and when you can do themwithout pain. If you rest the injury too much, it may not heal as well. Some injuries heal better with early movement instead of resting for too long. Ask your doctor ifyou should do exercises to help your injury get better. Ice  If told, put ice on the injured area. To do this: Put ice in a plastic bag. Place a towel between your skin and the bag. Leave the ice on for 20 minutes, 2-3 times a day. Take off the ice if your skin turns bright red. This is very important. If you cannot feel pain, heat, or cold, you have a greater risk of damage to the area. Do not put ice on your bare skin. Use ice for as many days as your doctor tells you to use it.  Compression Put pressure on the injured area. This can be done with an elastic bandage. If this type of bandage has been put on your injury: Follow instructions on the package the bandage came in about how to use it. Do not wrap the bandage too tightly. Wrap the bandage more loosely if part of your body beyond the bandage is blue, swollen, cold, painful, or loses feeling. Take off the bandage and put it on again every 3-4 hours or as told by your doctor. See your doctor if the bandage seems to make your problems worse.  Elevation Raise the injured area above the level of your heart while you  are sitting orlying down. Follow these instructions at home: If your symptoms get worse or last a long time, make a follow-up appointment with your doctor. You may need to have imaging tests, such as X-rays or an MRI. If you have imaging tests, ask how to get your results when they are ready. Return to your normal activities when your doctor says that it is safe. Keep all follow-up visits. Contact a doctor if: You keep having pain and swelling. Your symptoms get worse. Get help right away if: You have sudden, very bad pain at your injury or lower than your injury. You have redness or more swelling around your injury. You have tingling or numbness at your injury or lower than your injury, and it does not go away when you take off the bandage. Summary Many injuries can be cared for using rest, ice, compression, and elevation (RICE therapy). You can go back to your normal activities when your doctor says it is okay and when you can do them without pain. Put ice on the injured area as told by your doctor. Get help if your symptoms get worse or if you keep having pain and swelling. This information is not intended to replace advice given to you by your health care provider. Make sure you discuss any questions you have with your healthcare provider. Document Revised: 12/08/2019 Document Reviewed: 12/08/2019 Elsevier Patient Education    2022 Elsevier Inc.  

## 2021-05-14 NOTE — Progress Notes (Signed)
? ?Subjective:  ? ? Patient ID: Katie Martinez, female    DOB: [birth year], 61 y.o.   MRN: [id number] ? ?HPI ? ?Patient presents to clinic today with complaint of left ankle pain and swelling.  This started 1 week ago after slamming her ankle in the car door.  She reports the area is very tender.  She feels like it has gotten worse because of the swelling.  She is taking Amlodipine which could be a contributing factor.  She was on HCTZ in the past but stopped this because she felt like it was causing too much weight loss. ? ?Review of Systems ? ?   ?Past Medical History:  ?Diagnosis Date  ? Hyperlipidemia   ? Hypertension   ? ? ?Current Outpatient Medications  ?Medication Sig Dispense Refill  ? amLODipine (NORVASC) 10 MG tablet TAKE 1 TABLET BY MOUTH EVERY DAY 90 tablet 1  ? Cholecalciferol (VITAMIN D) 50 MCG (2000 UT) CAPS Take by mouth.    ? estradiol (ESTRACE) 0.1 MG/GM vaginal cream Place 1 Applicatorful vaginally at bedtime. 42.5 g 0  ? LORazepam (ATIVAN) 0.5 MG tablet Take 1 tablet (0.5 mg total) by mouth daily as needed for anxiety. 20 tablet 0  ? Multiple Vitamin (MULTIVITAMIN) tablet Take 1 tablet by mouth daily.    ? olmesartan (BENICAR) 20 MG tablet Take 1 tablet (20 mg total) by mouth daily. 30 tablet 0  ? ondansetron (ZOFRAN ODT) 4 MG disintegrating tablet Take 1 tablet (4 mg total) by mouth every 8 (eight) hours as needed for nausea or vomiting. 20 tablet 0  ? vitamin C (ASCORBIC ACID) 250 MG tablet Take 250 mg by mouth daily.    ? ?No current facility-administered medications for this visit.  ? ? ?Allergies  ?Allergen Reactions  ? Lisinopril Other (See Comments)  ?  Dry cough ? ?  ? Metoprolol Other (See Comments)  ?  Vivid dreams, dizziness  ? ? ?Family History  ?Problem Relation Age of Onset  ? Lymphoma Mother   ? Hypertension Mother   ? Hyperlipidemia Mother   ? Arthritis Father   ? Hyperlipidemia Father   ? Hypertension Father   ? Stroke Father   ? Hyperlipidemia Brother   ? Hypertension  Brother   ? Prostate cancer Brother   ? Skin cancer Brother   ? Arthritis Maternal Grandmother   ? Lymphoma Maternal Grandmother   ? Stroke Maternal Grandmother   ? Brain cancer Maternal Grandfather   ? Hypertension Maternal Grandfather   ? Hyperlipidemia Maternal Grandfather   ? Heart disease Maternal Grandfather   ? Heart disease Paternal Grandmother   ? Hyperlipidemia Paternal Grandmother   ? Hypertension Paternal Grandmother   ? Stroke Paternal Grandfather   ? Arthritis Paternal Grandfather   ? Hypertension Brother   ? Hyperlipidemia Brother   ? Prostate cancer Brother   ? Skin cancer Brother   ? Breast cancer Maternal Aunt   ? Breast cancer Maternal Aunt   ? ? ?Social History  ? ?Socioeconomic History  ? Marital status: Married  ?  Spouse name: Not on file  ? Number of children: Not on file  ? Years of education: Not on file  ? Highest education level: Not on file  ?Occupational History  ? Not on file  ?Tobacco Use  ? Smoking status: Never  ? Smokeless tobacco: Never  ?Vaping Use  ? Vaping Use: Never used  ?Substance and Sexual Activity  ? Alcohol use: Yes  ?  Comment: 2 glasses at night  ? Drug use: Never  ? Sexual activity: Not on file  ?Other Topics Concern  ? Not on file  ?Social History Narrative  ? Not on file  ? ?Social Determinants of Health  ? ?Financial Resource Strain: Not on file  ?Food Insecurity: Not on file  ?Transportation Needs: Not on file  ?Physical Activity: Not on file  ?Stress: Not on file  ?Social Connections: Not on file  ?Intimate Partner Violence: Not on file  ? ? ? ?Constitutional: Denies fever, malaise, fatigue, headache or abrupt weight changes.  ?Respiratory: Denies difficulty breathing, shortness of breath, cough or sputum production.   ?Cardiovascular: Denies chest pain, chest tightness, palpitations or swelling in the hands or feet.  ?Musculoskeletal: Patient reports left ankle pain and swelling.  Denies decrease in range of motion, difficulty with gait, muscle pain  ?Skin:  Denies redness, rashes, lesions or ulcercations.  ?Neurological: Denies numbness, tingling, weakness or problems with balance and coordination.  ? ? ?No other specific complaints in a complete review of systems (except as listed in HPI above). ? ?Objective:  ? Physical Exam ?BP 126/73 (BP Location: Right Arm, Patient Position: Sitting, Cuff Size: Normal)   Pulse 74   Temp (!) 97.3 ?F (36.3 ?C) (Temporal)   Wt 105 lb (47.6 kg)   SpO2 100%   BMI 20.51 kg/m?  ? ?Wt Readings from Last 3 Encounters:  ?04/08/21 104 lb (47.2 kg)  ?02/13/21 104 lb 12.8 oz (47.5 kg)  ?02/04/21 100 lb (45.4 kg)  ? ? ?General: Appears her stated age in NAD. ?Cardiovascular: Normal rate. ?Pulmonary/Chest: Normal effort and positive vesicular breath sounds.  ?Musculoskeletal: Normal flexion, extension, rotation of the left ankle.  Slight swelling noted posterior to bilateral malleoli of the left ankle.  Pain with palpation posteriorly of the malleoli of the left ankle.  No difficulty with gait.  ?Neurological: Alert and oriented.  ? ? ?BMET ?   ?Component Value Date/Time  ? NA 139 02/13/2021 0822  ? K 3.7 02/13/2021 0822  ? CL 100 02/13/2021 0822  ? CO2 29 02/13/2021 0822  ? GLUCOSE 81 02/13/2021 0822  ? BUN 10 02/13/2021 0822  ? CREATININE 0.65 02/13/2021 0822  ? CALCIUM 9.4 02/13/2021 0822  ? GFRNONAA >60 11/18/2020 1523  ? ? ?Lipid Panel  ?   ?Component Value Date/Time  ? CHOL 244 (H) 02/13/2021 1062  ? TRIG 199 (H) 02/13/2021 6948  ? HDL 67 02/13/2021 0822  ? CHOLHDL 3.6 02/13/2021 0822  ? VLDL 30.0 02/08/2020 1541  ? LDLCALC 143 (H) 02/13/2021 5462  ? ? ?CBC ?   ?Component Value Date/Time  ? WBC 7.5 02/13/2021 0822  ? RBC 4.07 02/13/2021 0822  ? HGB 13.6 02/13/2021 0822  ? HCT 39.8 02/13/2021 0822  ? PLT 230 02/13/2021 0822  ? MCV 97.8 02/13/2021 0822  ? MCH 33.4 (H) 02/13/2021 7035  ? MCHC 34.2 02/13/2021 0822  ? RDW 11.8 02/13/2021 0822  ? LYMPHSABS 1.2 11/18/2020 1523  ? MONOABS 0.5 11/18/2020 1523  ? EOSABS 0.0 11/18/2020 1523  ?  BASOSABS 0.0 11/18/2020 1523  ? ? ?Hgb A1C ?No results found for: HGBA1C ? ? ? ? ? ? ?   ?Assessment & Plan:  ?Left Ankle Pain and Swelling: ? ?We will obtain x-ray of the left ankle status post traumatic event ?Swelling could be coming from the amlodipine however discussed the benefits versus the side effects in this case ?Encourage elevation ? ?We will follow-up after imaging  results with further recommendation and treatment plan ? ?Nicki Reaperegina Colton Tassin, NP ?This visit occurred during the SARS-CoV-2 public health emergency.  Safety protocols were in place, including screening questions prior to the visit, additional usage of staff PPE, and extensive cleaning of exam room while observing appropriate contact time as indicated for disinfecting solutions.  ? ? ?

## 2021-05-15 ENCOUNTER — Other Ambulatory Visit: Payer: Self-pay | Admitting: Internal Medicine

## 2021-05-15 NOTE — Telephone Encounter (Signed)
Receipt confirmed by pharmacy 05/14/21  At 3:43 ? ?Requested Prescriptions  ?Pending Prescriptions Disp Refills  ?? olmesartan (BENICAR) 20 MG tablet [Pharmacy Med Name: OLMESARTAN MEDOXOMIL 20 MG TAB] 30 tablet 0  ?  Sig: TAKE 1 TABLET BY MOUTH EVERY DAY  ?  ? Cardiovascular:  Angiotensin Receptor Blockers Passed - 05/15/2021  1:34 PM  ?  ?  Passed - Cr in normal range and within 180 days  ?  Creat  ?Date Value Ref Range Status  ?02/13/2021 0.65 0.50 - 1.05 mg/dL Final  ?   ?  ?  Passed - K in normal range and within 180 days  ?  Potassium  ?Date Value Ref Range Status  ?02/13/2021 3.7 3.5 - 5.3 mmol/L Final  ?   ?  ?  Passed - Patient is not pregnant  ?  ?  Passed - Last BP in normal range  ?  BP Readings from Last 1 Encounters:  ?05/14/21 126/73  ?   ?  ?  Passed - Valid encounter within last 6 months  ?  Recent Outpatient Visits   ?      ? Yesterday Pain and swelling of left ankle  ? [facility] [place], [place], NP  ? 1 month ago Chest tightness  ? [facility] [place], Coralie Keens, NP  ? 3 months ago Encounter for general adult medical examination with abnormal findings  ? [facility] [place], Coralie Keens, NP  ?  ?  ? ?  ?  ?  ? ? ?

## 2021-05-16 ENCOUNTER — Encounter: Payer: Self-pay | Admitting: Internal Medicine

## 2021-07-22 ENCOUNTER — Encounter: Payer: Self-pay | Admitting: Internal Medicine

## 2021-07-22 DIAGNOSIS — I10 Essential (primary) hypertension: Secondary | ICD-10-CM

## 2021-07-22 DIAGNOSIS — F32A Depression, unspecified: Secondary | ICD-10-CM

## 2021-07-22 DIAGNOSIS — R609 Edema, unspecified: Secondary | ICD-10-CM

## 2021-07-23 MED ORDER — LORAZEPAM 0.5 MG PO TABS: 0.5000 mg | ORAL_TABLET | Freq: Every day | ORAL | 0 refills | Status: DC | PRN

## 2021-07-23 NOTE — Telephone Encounter (Signed)
Please see the MyChart message reply(ies) for my assessment and plan.    This patient gave consent for this Medical Advice Message and is aware that it may result in a bill to their insurance company, as well as the possibility of receiving a bill for a co-payment or deductible. They are an established patient, but are not seeking medical advice exclusively about a problem treated during an in person or video visit in the last seven days. I did not recommend an in person or video visit within seven days of my reply.    I spent a total of 4 minutes cumulative time within 7 days through MyChart messaging.  Macrae Wiegman, NP   

## 2021-08-02 ENCOUNTER — Other Ambulatory Visit: Payer: BC Managed Care – PPO

## 2021-08-02 DIAGNOSIS — E782 Mixed hyperlipidemia: Secondary | ICD-10-CM | POA: Diagnosis not present

## 2021-08-03 LAB — LIPID PANEL
Cholesterol: 222 mg/dL — ABNORMAL HIGH (ref <200)
HDL: 71 mg/dL (ref >=50)
LDL Cholesterol (Calc): 130 mg/dL (calc) — ABNORMAL HIGH
Non-HDL Cholesterol (Calc): 151 mg/dL (calc) — ABNORMAL HIGH (ref <130)
Total CHOL/HDL Ratio: 3.1 (calc) (ref <5.0)
Triglycerides: 107 mg/dL (ref <150)

## 2021-08-06 ENCOUNTER — Ambulatory Visit (INDEPENDENT_AMBULATORY_CARE_PROVIDER_SITE_OTHER): Payer: BC Managed Care – PPO | Admitting: Internal Medicine

## 2021-08-06 ENCOUNTER — Encounter: Payer: Self-pay | Admitting: Internal Medicine

## 2021-08-06 DIAGNOSIS — F419 Anxiety disorder, unspecified: Secondary | ICD-10-CM

## 2021-08-06 DIAGNOSIS — I10 Essential (primary) hypertension: Secondary | ICD-10-CM

## 2021-08-06 DIAGNOSIS — E7800 Pure hypercholesterolemia, unspecified: Secondary | ICD-10-CM | POA: Diagnosis not present

## 2021-08-06 DIAGNOSIS — F32A Depression, unspecified: Secondary | ICD-10-CM

## 2021-08-06 DIAGNOSIS — N952 Postmenopausal atrophic vaginitis: Secondary | ICD-10-CM | POA: Diagnosis not present

## 2021-08-06 MED ORDER — AMLODIPINE BESYLATE 5 MG PO TABS: 5.0000 mg | ORAL_TABLET | Freq: Every day | ORAL | 1 refills | Status: DC

## 2021-08-06 NOTE — Patient Instructions (Signed)
Heart-Healthy Eating Plan Heart-healthy meal planning includes: Eating less unhealthy fats. Eating more healthy fats. Making other changes in your diet. Talk with your doctor or a diet specialist (dietitian) to create an eating plan that is right for you. What is my plan? Your doctor may recommend an eating plan that includes: Total fat: ______% or less of total calories a day. Saturated fat: ______% or less of total calories a day. Cholesterol: less than _________mg a day. What are tips for following this plan? Cooking Avoid frying your food. Try to bake, boil, grill, or broil it instead. You can also reduce fat by: Removing the skin from poultry. Removing all visible fats from meats. Steaming vegetables in water or broth. Meal planning  At meals, divide your plate into four equal parts: Fill one-half of your plate with vegetables and green salads. Fill one-fourth of your plate with whole grains. Fill one-fourth of your plate with lean protein foods. Eat 4-5 servings of vegetables per day. A serving of vegetables is: 1 cup of raw or cooked vegetables. 2 cups of raw leafy greens. Eat 4-5 servings of fruit per day. A serving of fruit is: 1 medium whole fruit.  cup of dried fruit.  cup of fresh, frozen, or canned fruit.  cup of 100% fruit juice. Eat more foods that have soluble fiber. These are apples, broccoli, carrots, beans, peas, and barley. Try to get 20-30 g of fiber per day. Eat 4-5 servings of nuts, legumes, and seeds per week: 1 serving of dried beans or legumes equals  cup after being cooked. 1 serving of nuts is  cup. 1 serving of seeds equals 1 tablespoon. General information Eat more home-cooked food. Eat less restaurant, buffet, and fast food. Limit or avoid alcohol. Limit foods that are high in starch and sugar. Avoid fried foods. Lose weight if you are overweight. Keep track of how much salt (sodium) you eat. This is important if you have high blood  pressure. Ask your doctor to tell you more about this. Try to add vegetarian meals each week. Fats Choose healthy fats. These include olive oil and canola oil, flaxseeds, walnuts, almonds, and seeds. Eat more omega-3 fats. These include salmon, mackerel, sardines, tuna, flaxseed oil, and ground flaxseeds. Try to eat fish at least 2 times each week. Check food labels. Avoid foods with trans fats or high amounts of saturated fat. Limit saturated fats. These are often found in animal products, such as meats, butter, and cream. These are also found in plant foods, such as palm oil, palm kernel oil, and coconut oil. Avoid foods with partially hydrogenated oils in them. These have trans fats. Examples are stick margarine, some tub margarines, cookies, crackers, and other baked goods. What foods can I eat? Fruits All fresh, canned (in natural juice), or frozen fruits. Vegetables Fresh or frozen vegetables (raw, steamed, roasted, or grilled). Green salads. Grains Most grains. Choose whole wheat and whole grains most of the time. Rice and pasta, including brown rice and pastas made with whole wheat. Meats and other proteins Lean, well-trimmed beef, veal, pork, and lamb. Chicken and turkey without skin. All fish and shellfish. Wild duck, rabbit, pheasant, and venison. Egg whites or low-cholesterol egg substitutes. Dried beans, peas, lentils, and tofu. Seeds and most nuts. Dairy Low-fat or nonfat cheeses, including ricotta and mozzarella. Skim or 1% milk that is liquid, powdered, or evaporated. Buttermilk that is made with low-fat milk. Nonfat or low-fat yogurt. Fats and oils Non-hydrogenated (trans-free) margarines. Vegetable oils, including   soybean, sesame, sunflower, olive, peanut, safflower, corn, canola, and cottonseed. Salad dressings or mayonnaise made with a vegetable oil. Beverages Mineral water. Coffee and tea. Diet carbonated beverages. Sweets and desserts Sherbet, gelatin, and fruit ice.  Small amounts of dark chocolate. Limit all sweets and desserts. Seasonings and condiments All seasonings and condiments. The items listed above may not be a complete list of foods and drinks you can eat. Contact a dietitian for more options. What foods should I avoid? Fruits Canned fruit in heavy syrup. Fruit in cream or butter sauce. Fried fruit. Limit coconut. Vegetables Vegetables cooked in cheese, cream, or butter sauce. Fried vegetables. Grains Breads that are made with saturated or trans fats, oils, or whole milk. Croissants. Sweet rolls. Donuts. High-fat crackers, such as cheese crackers. Meats and other proteins Fatty meats, such as hot dogs, ribs, sausage, bacon, rib-eye roast or steak. High-fat deli meats, such as salami and bologna. Caviar. Domestic duck and goose. Organ meats, such as liver. Dairy Cream, sour cream, cream cheese, and creamed cottage cheese. Whole-milk cheeses. Whole or 2% milk that is liquid, evaporated, or condensed. Whole buttermilk. Cream sauce or high-fat cheese sauce. Yogurt that is made from whole milk. Fats and oils Meat fat, or shortening. Cocoa butter, hydrogenated oils, palm oil, coconut oil, palm kernel oil. Solid fats and shortenings, including bacon fat, salt pork, lard, and butter. Nondairy cream substitutes. Salad dressings with cheese or sour cream. Beverages Regular sodas and juice drinks with added sugar. Sweets and desserts Frosting. Pudding. Cookies. Cakes. Pies. Milk chocolate or white chocolate. Buttered syrups. Full-fat ice cream or ice cream drinks. The items listed above may not be a complete list of foods and drinks to avoid. Contact a dietitian for more information. Summary Heart-healthy meal planning includes eating less unhealthy fats, eating more healthy fats, and making other changes in your diet. Eat a balanced diet. This includes fruits and vegetables, low-fat or nonfat dairy, lean protein, nuts and legumes, whole grains, and  heart-healthy oils and fats. This information is not intended to replace advice given to you by your health care provider. Make sure you discuss any questions you have with your health care provider. Document Revised: 06/28/2020 Document Reviewed: 06/28/2020 Elsevier Patient Education  2022 Elsevier Inc.  

## 2021-08-06 NOTE — Assessment & Plan Note (Signed)
Continue Lorazepam as needed We will obtain CSA at next visit

## 2021-08-06 NOTE — Assessment & Plan Note (Signed)
Encouraged her to consume low-fat diet 

## 2021-08-06 NOTE — Assessment & Plan Note (Signed)
She is no longer using Estrace cream We will monitor

## 2021-08-06 NOTE — Assessment & Plan Note (Signed)
Controlled on Olmesartan and Amlodipine Reinforced DASH diet We will monitor

## 2021-08-06 NOTE — Progress Notes (Signed)
Subjective:    Patient ID: Katie Martinez, female    DOB: [birth year], 61 y.o.   MRN: [id number]  HPI  Patient presents to clinic today for 102-month follow-up of chronic conditions.  HTN: Her BP today is 126/80.  She is taking Olmesartan and Amlodipine as prescribed.  ECG from 04/2021 reviewed.  Anxiety and Depression: Chronic, managed on Lorazepam as needed.  She is not currently seeing a therapist.  She denies SI/HI.  HLD: Her last LDL was 130, triglycerides 107, 08/2021.  She is not currently taking any cholesterol-lowering medication.  She tries to consume low-fat diet.  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  She does not follow with gynecology.  Review of Systems     Past Medical History:  Diagnosis Date   Hyperlipidemia    Hypertension     Current Outpatient Medications  Medication Sig Dispense Refill   amLODipine (NORVASC) 10 MG tablet TAKE 1 TABLET BY MOUTH EVERY DAY 90 tablet 1   Cholecalciferol (VITAMIN D) 50 MCG (2000 UT) CAPS Take by mouth.     estradiol (ESTRACE) 0.1 MG/GM vaginal cream Place 1 Applicatorful vaginally at bedtime. 42.5 g 0   LORazepam (ATIVAN) 0.5 MG tablet Take 1 tablet (0.5 mg total) by mouth daily as needed for anxiety. 20 tablet 0   Multiple Vitamin (MULTIVITAMIN) tablet Take 1 tablet by mouth daily.     olmesartan (BENICAR) 20 MG tablet Take 1 tablet (20 mg total) by mouth daily. 90 tablet 1   ondansetron (ZOFRAN ODT) 4 MG disintegrating tablet Take 1 tablet (4 mg total) by mouth every 8 (eight) hours as needed for nausea or vomiting. 20 tablet 0   vitamin C (ASCORBIC ACID) 250 MG tablet Take 250 mg by mouth daily.     No current facility-administered medications for this visit.    Allergies  Allergen Reactions   Lisinopril Other (See Comments)    Dry cough     Metoprolol Other (See Comments)    Vivid dreams, dizziness    Family History  Problem Relation Age of Onset   Lymphoma Mother     Hypertension Mother    Hyperlipidemia Mother    Arthritis Father    Hyperlipidemia Father    Hypertension Father    Stroke Father    Hyperlipidemia Brother    Hypertension Brother    Prostate cancer Brother    Skin cancer Brother    Arthritis Maternal Grandmother    Lymphoma Maternal Grandmother    Stroke Maternal Grandmother    Brain cancer Maternal Grandfather    Hypertension Maternal Grandfather    Hyperlipidemia Maternal Grandfather    Heart disease Maternal Grandfather    Heart disease Paternal Grandmother    Hyperlipidemia Paternal Grandmother    Hypertension Paternal Grandmother    Stroke Paternal Grandfather    Arthritis Paternal Grandfather    Hypertension Brother    Hyperlipidemia Brother    Prostate cancer Brother    Skin cancer Brother    Breast cancer Maternal Aunt    Breast cancer Maternal Aunt     Social History   Socioeconomic History   Marital status: Married    Spouse name: Not on file   Number of children: Not on file   Years of education: Not on file   Highest education level: Not on file  Occupational History   Not on file  Tobacco Use   Smoking status: Never   Smokeless tobacco: Never  Vaping Use  Vaping Use: Never used  Substance and Sexual Activity   Alcohol use: Yes    Comment: 2 glasses at night   Drug use: Never   Sexual activity: Not on file  Other Topics Concern   Not on file  Social History Narrative   Not on file   Social Determinants of Health   Financial Resource Strain: Not on file  Food Insecurity: Not on file  Transportation Needs: Not on file  Physical Activity: Not on file  Stress: Not on file  Social Connections: Not on file  Intimate Partner Violence: Not on file     Constitutional: Denies fever, malaise, fatigue, headache or abrupt weight changes.  HEENT: Denies eye pain, eye redness, ear pain, ringing in the ears, wax buildup, runny nose, nasal congestion, bloody nose, or sore throat. Respiratory: Denies  difficulty breathing, shortness of breath, cough or sputum production.   Cardiovascular: Denies chest pain, chest tightness, palpitations or swelling in the hands or feet.  Gastrointestinal: Denies abdominal pain, bloating, constipation, diarrhea or blood in the stool.  GU: Patient reports vaginal dryness.  Denies urgency, frequency, pain with urination, burning sensation, blood in urine, odor or discharge. Musculoskeletal: Denies decrease in range of motion, difficulty with gait, muscle pain or joint pain and swelling.  Skin: Denies redness, rashes, lesions or ulcercations.  Neurological: Denies dizziness, difficulty with memory, difficulty with speech or problems with balance and coordination.  Psych: Patient has a history of anxiety and depression.  Denies SI/HI.  No other specific complaints in a complete review of systems (except as listed in HPI above).  Objective:   Physical Exam  BP 126/80 (BP Location: Left Arm, Patient Position: Sitting, Cuff Size: Normal)   Pulse 67   Temp (!) 96.9 F (36.1 C) (Temporal)   Wt 105 lb (47.6 kg)   SpO2 98%   BMI 20.51 kg/m   Wt Readings from Last 3 Encounters:  05/14/21 105 lb (47.6 kg)  04/08/21 104 lb (47.2 kg)  02/13/21 104 lb 12.8 oz (47.5 kg)    General: Appears her stated age, well developed, well nourished in NAD. Skin: Warm, dry and intact.  HEENT: Head: normal shape and size; Eyes: sclera white, no icterus, conjunctiva pink, PERRLA and EOMs intact;  Cardiovascular: Normal rate and rhythm. S1,S2 noted.  No murmur, rubs or gallops noted. No JVD or BLE edema. No carotid bruits noted. Pulmonary/Chest: Normal effort and positive vesicular breath sounds. No respiratory distress. No wheezes, rales or ronchi noted.  Musculoskeletal:  No difficulty with gait.  Neurological: Alert and oriented.  Psychiatric: Mood and affect normal. Behavior is normal. Judgment and thought content normal.    BMET    Component Value Date/Time   NA 139  02/13/2021 0822   K 3.7 02/13/2021 0822   CL 100 02/13/2021 0822   CO2 29 02/13/2021 0822   GLUCOSE 81 02/13/2021 0822   BUN 10 02/13/2021 0822   CREATININE 0.65 02/13/2021 0822   CALCIUM 9.4 02/13/2021 0822   GFRNONAA >60 11/18/2020 1523    Lipid Panel     Component Value Date/Time   CHOL 222 (H) 08/02/2021 0820   TRIG 107 08/02/2021 0820   HDL 71 08/02/2021 0820   CHOLHDL 3.1 08/02/2021 0820   VLDL 30.0 02/08/2020 1541   LDLCALC 130 (H) 08/02/2021 0820    CBC    Component Value Date/Time   WBC 7.5 02/13/2021 0822   RBC 4.07 02/13/2021 0822   HGB 13.6 02/13/2021 0822   HCT 39.8  02/13/2021 0822   PLT 230 02/13/2021 0822   MCV 97.8 02/13/2021 0822   MCH 33.4 (H) 02/13/2021 0822   MCHC 34.2 02/13/2021 0822   RDW 11.8 02/13/2021 0822   LYMPHSABS 1.2 11/18/2020 1523   MONOABS 0.5 11/18/2020 1523   EOSABS 0.0 11/18/2020 1523   BASOSABS 0.0 11/18/2020 1523    Hgb A1C No results found for: HGBA1C        Assessment & Plan:     Nicki Reaper, NP

## 2021-08-16 ENCOUNTER — Other Ambulatory Visit: Payer: Self-pay | Admitting: Internal Medicine

## 2021-08-16 NOTE — Telephone Encounter (Signed)
Refilled 05/14/2021 #90 1 refill. Requested Prescriptions  Pending Prescriptions Disp Refills  . olmesartan (BENICAR) 20 MG tablet [Pharmacy Med Name: OLMESARTAN MEDOXOMIL 20 MG TAB] 90 tablet 1    Sig: TAKE 1 TABLET BY MOUTH EVERY DAY     Cardiovascular:  Angiotensin Receptor Blockers Failed - 08/16/2021  4:50 AM      Failed - Cr in normal range and within 180 days    Creat  Date Value Ref Range Status  02/13/2021 0.65 0.50 - 1.05 mg/dL Final         Failed - K in normal range and within 180 days    Potassium  Date Value Ref Range Status  02/13/2021 3.7 3.5 - 5.3 mmol/L Final         Passed - Patient is not pregnant      Passed - Last BP in normal range    BP Readings from Last 1 Encounters:  08/06/21 126/80         Passed - Valid encounter within last 6 months    Recent Outpatient Visits          1 week ago Primary hypertension   [facility] [place], Salvadore Oxford, NP   3 months ago Pain and swelling of left ankle   [facility] [place], Salvadore Oxford, NP   4 months ago Chest tightness   [facility] [place], Salvadore Oxford, NP   6 months ago Encounter for general adult medical examination with abnormal findings   [facility], Salvadore Oxford, NP      Future Appointments            In 5 months Baity, Salvadore Oxford, NP [facility], [facility]

## 2021-10-14 ENCOUNTER — Other Ambulatory Visit: Payer: Self-pay

## 2021-10-14 ENCOUNTER — Ambulatory Visit
Admission: RE | Admit: 2021-10-14 | Discharge: 2021-10-14 | Disposition: A | Discharge status: Home / Self Care | Payer: BC Managed Care – PPO | Source: Ambulatory Visit | Attending: Family Medicine | Admitting: Family Medicine

## 2021-10-14 VITALS — BP 133/76 | HR 97 | Temp 98.5°F | Resp 18

## 2021-10-14 DIAGNOSIS — R21 Rash and other nonspecific skin eruption: Secondary | ICD-10-CM | POA: Diagnosis not present

## 2021-10-14 MED ORDER — TRIAMCINOLONE ACETONIDE 0.1 % EX CREA: 1.0000 | TOPICAL_CREAM | Freq: Three times a day (TID) | CUTANEOUS | 0 refills | Status: DC | PRN

## 2021-10-14 NOTE — ED Provider Notes (Signed)
Katie Martinez    CSN: [id number] Arrival date & time: 10/14/21  1514      History   Chief Complaint Chief Complaint  Patient presents with  . Rash    Itchy rash on torso - Entered by patient    HPI Katie Martinez is a 61 y.o. female.   HPI Patient here for evaluation of skin sensitivity and rash eruption below her bra line that has been present intermittently since 5 days ago. The rash is most prominent following showering. She characterizes the rash as burning when makes contact with hot water. She has taken benadryl and applied topical benadryl cream  Past Medical History:  Diagnosis Date  . Hyperlipidemia   . Hypertension     Patient Active Problem List   Diagnosis Date Noted  . 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 Anxiety and depression 09/19/2019  . HTN (hypertension) 09/19/2019  . HLD (hyperlipidemia) 09/19/2019    Past Surgical History:  Procedure Laterality Date  . BREAST CYST ASPIRATION Left    20 years ago  . CHOLECYSTECTOMY    . COLONOSCOPY WITH PROPOFOL N/A 02/04/2021   Procedure: COLONOSCOPY WITH PROPOFOL;  Surgeon: [name], MD;  Location: [facility] END[facility]COPY;  Service: Gastroenterology;  Laterality: N/A;  . LAPAROSCOPIC ENDOMETRIOSIS FULGURATION    . TONSILLECTOMY AND ADENOIDECTOMY      OB History   No obstetric history on file.      Home Medications    Prior to Admission medications   Medication Sig Start Date End Date Taking? Authorizing Provider  amLODipine (NORVASC) 5 MG tablet Take 1 tablet (5 mg total) by mouth daily. 08/06/21   Lorre Munroe, NP  Cholecalciferol (VITAMIN D) 50 MCG (2000 UT) CAPS Take by mouth.    [provider]  LORazepam (ATIVAN) 0.5 MG tablet Take 1 tablet (0.5 mg total) by mouth daily as needed for anxiety. 07/23/21   Lorre Munroe, NP  Multiple Vitamin (MULTIVITAMIN) tablet Take 1 tablet by mouth daily.    [provider]  olmesartan (BENICAR) 20 MG tablet Take 1 tablet  (20 mg total) by mouth daily. 05/14/21   Lorre Munroe, NP  ondansetron (ZOFRAN ODT) 4 MG disintegrating tablet Take 1 tablet (4 mg total) by mouth every 8 (eight) hours as needed for nausea or vomiting. 11/18/20   Cheryll Cockayne, MD  vitamin C (ASCORBIC ACID) 250 MG tablet Take 250 mg by mouth daily.    [provider]    Family History Family History  Problem Relation Age of Onset  . Lymphoma Mother   . Hypertension Mother   . Hyperlipidemia Mother   . Arthritis Father   . Hyperlipidemia Father   . Hypertension Father   . Stroke Father   . Hyperlipidemia Brother   . Hypertension Brother   . Prostate cancer Brother   . Skin cancer Brother   . Arthritis Maternal Grandmother   . Lymphoma Maternal Grandmother   . Stroke Maternal Grandmother   . Brain cancer Maternal Grandfather   . Hypertension Maternal Grandfather   . Hyperlipidemia Maternal Grandfather   . Heart disease Maternal Grandfather   . Heart disease Paternal Grandmother   . Hyperlipidemia Paternal Grandmother   . Hypertension Paternal Grandmother   . Stroke Paternal Grandfather   . Arthritis Paternal Grandfather   . Hypertension Brother   . Hyperlipidemia Brother   . Prostate cancer Brother   . Skin cancer Brother   . Breast cancer Maternal Aunt   .  Breast cancer Maternal Aunt     Social History Social History   Tobacco Use  . Smoking status: Never  . Smokeless tobacco: Never  Vaping Use  . Vaping Use: Never used  Substance Use Topics  . Alcohol use: Yes    Comment: 2 glasses at night  . Drug use: Never     Allergies   Lisinopril and Metoprolol   Review of Systems Review of Systems   Physical Exam Triage Vital Signs ED Triage Vitals  Enc Vitals Group     BP      Pulse      Resp      Temp      Temp src      SpO2      Weight      Height      Head Circumference      Peak Flow      Pain Score      Pain Loc      Pain Edu?      Excl. in GC?    No data found.  Updated Vital  Signs There were no vitals taken for this visit.  Visual Acuity Right Eye Distance:   Left Eye Distance:   Bilateral Distance:    Right Eye Near:   Left Eye Near:    Bilateral Near:     Physical Exam   UC Treatments / Results  Labs (all labs ordered are listed, but only abnormal results are displayed) Labs Reviewed - No data to display  EKG   Radiology No results found.  Procedures Procedures (including critical care time)  Medications Ordered in UC Medications - No data to display  Initial Impression / Assessment and Plan / UC Course  I have reviewed the triage vital signs and the nursing notes.  Pertinent labs & imaging results that were available during my care of the patient were reviewed by me and considered in my medical decision making (see chart for details).     *** Final Clinical Impressions(s) / UC Diagnoses   Final diagnoses:  None   Discharge Instructions   None    ED Prescriptions   None    PDMP not reviewed this encounter.

## 2021-10-14 NOTE — ED Triage Notes (Signed)
Pt reports her skin is sensitive to water touching the area just below bra line in front. Pt  describes feeling bubbles on skin . On observation no fluid filled areas seen. Pt points to small red dots on anterior upper ABD.

## 2021-10-14 NOTE — Discharge Instructions (Addendum)
Follow-up with PCP if skin irritation persists despite treatment with topical steriod.

## 2021-11-06 ENCOUNTER — Other Ambulatory Visit: Payer: Self-pay | Admitting: Internal Medicine

## 2021-11-07 NOTE — Telephone Encounter (Signed)
Requested medication (s) are due for refill today: yes  Requested medication (s) are on the active medication list: yes  Last refill:  05/14/21 #90 1 RF  Future visit scheduled: yes  Notes to clinic:  overdue lab work    Requested Prescriptions  Pending Prescriptions Disp Refills   olmesartan (BENICAR) 20 MG tablet [Pharmacy Med Name: OLMESARTAN MEDOXOMIL 20 MG TAB] 90 tablet 1    Sig: TAKE 1 TABLET BY MOUTH EVERY DAY     Cardiovascular:  Angiotensin Receptor Blockers Failed - 11/06/2021  2:15 AM      Failed - Cr in normal range and within 180 days    Creat  Date Value Ref Range Status  02/13/2021 0.65 0.50 - 1.05 mg/dL Final         Failed - K in normal range and within 180 days    Potassium  Date Value Ref Range Status  02/13/2021 3.7 3.5 - 5.3 mmol/L Final         Passed - Patient is not pregnant      Passed - Last BP in normal range    BP Readings from Last 1 Encounters:  10/14/21 133/76         Passed - Valid encounter within last 6 months    Recent Outpatient Visits           3 months ago Primary hypertension   [facility] [place], Salvadore Oxford, NP   5 months ago Pain and swelling of left ankle   [facility] [place], Salvadore Oxford, NP   7 months ago Chest tightness   [facility] [place], Salvadore Oxford, NP   8 months ago Encounter for general adult medical examination with abnormal findings   [facility] [place], Salvadore Oxford, NP       Future Appointments             In 3 months Baity, Salvadore Oxford, NP [facility], [facility]

## 2021-12-10 IMAGING — MG MM DIGITAL SCREENING BILAT W/ TOMO AND CAD
8 series · 9 of 24 positions shown · non-contrast
Comparison: Previous exam(s).

CLINICAL DATA: Screening.

EXAM:
DIGITAL SCREENING BILATERAL MAMMOGRAM WITH TOMOSYNTHESIS AND CAD
TECHNIQUE: Bilateral screening digital craniocaudal and mediolateral oblique
mammograms were obtained. Bilateral screening digital breast
tomosynthesis was performed. The images were evaluated with
computer-aided detection.

[R CC synth-2D]
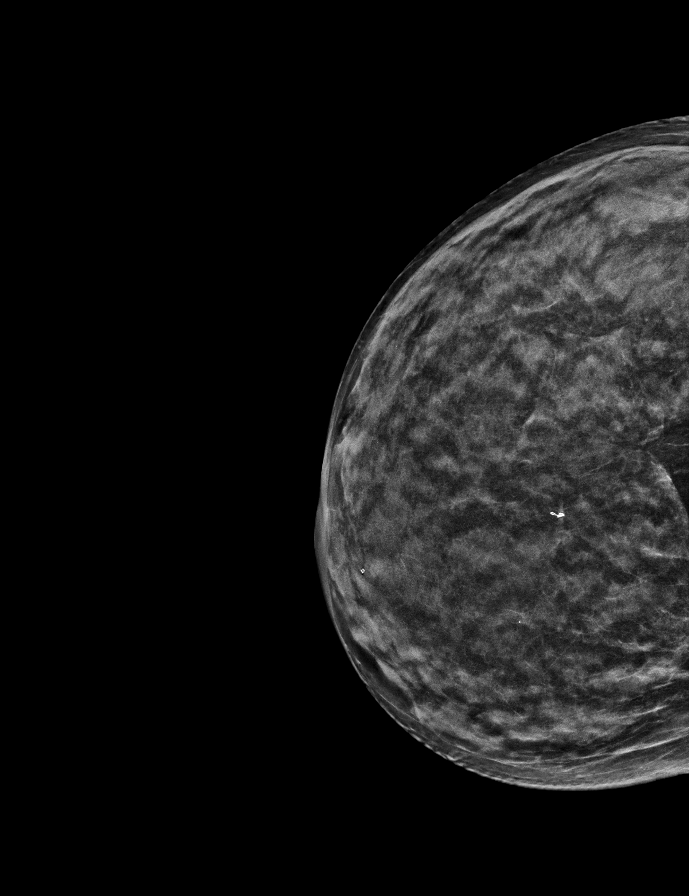

[R MLO synth-2D]
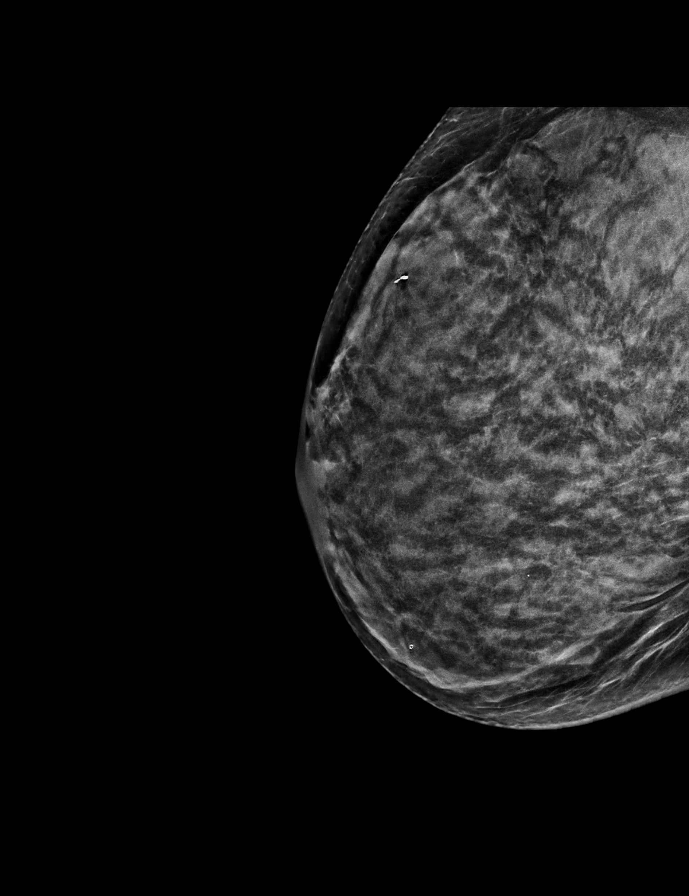

[L MLO synth-2D]
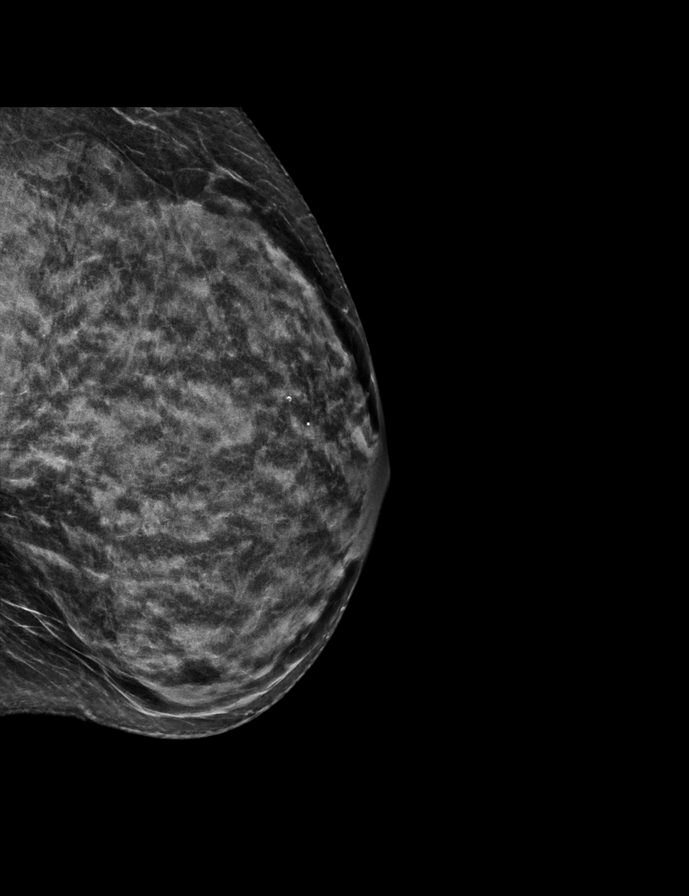

[L CC synth-2D]
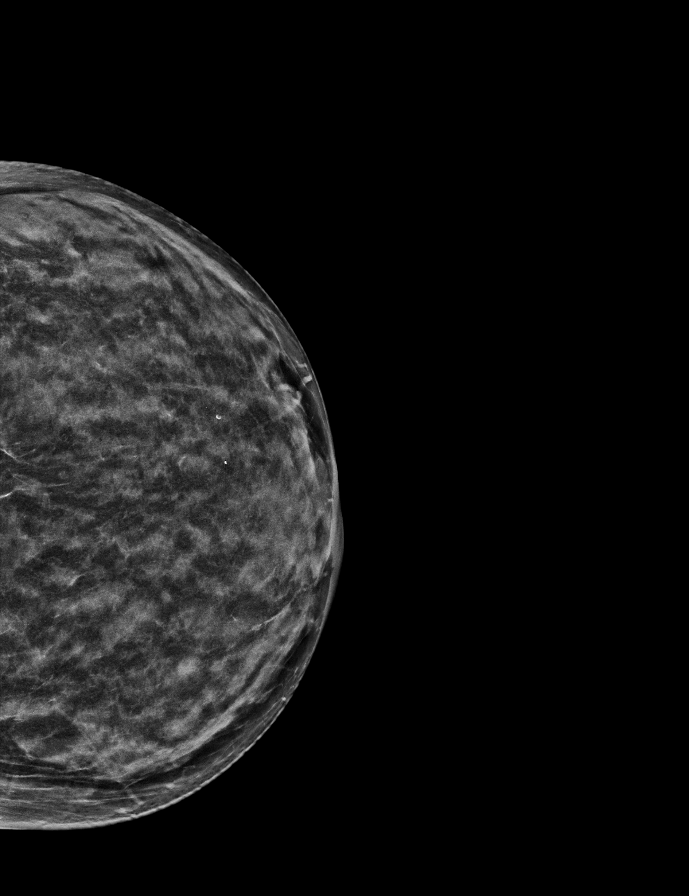

[L CC tomo · 2 of 51 frames shown]
[frame 17/51]
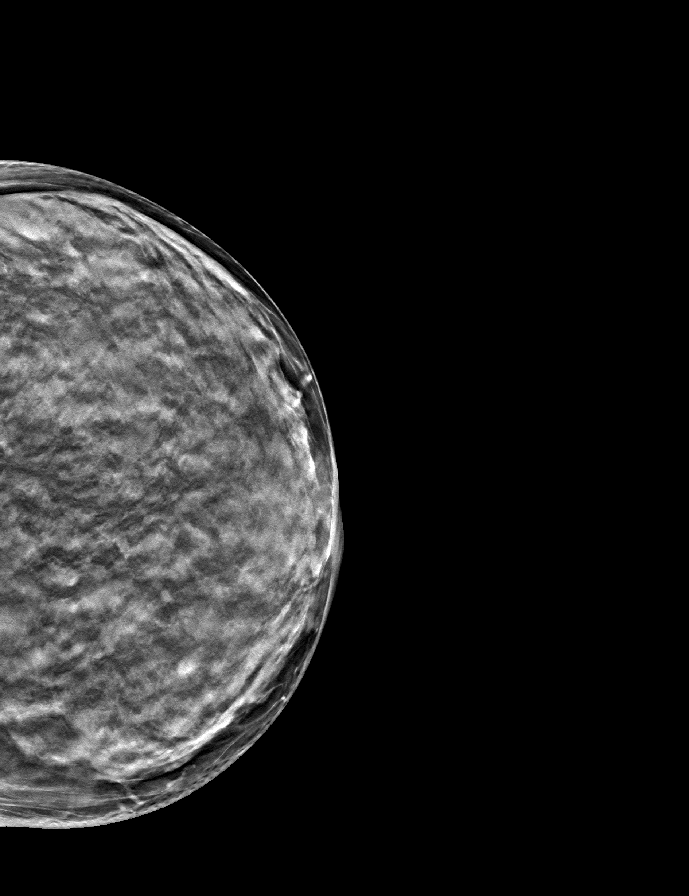
[frame 26/51]
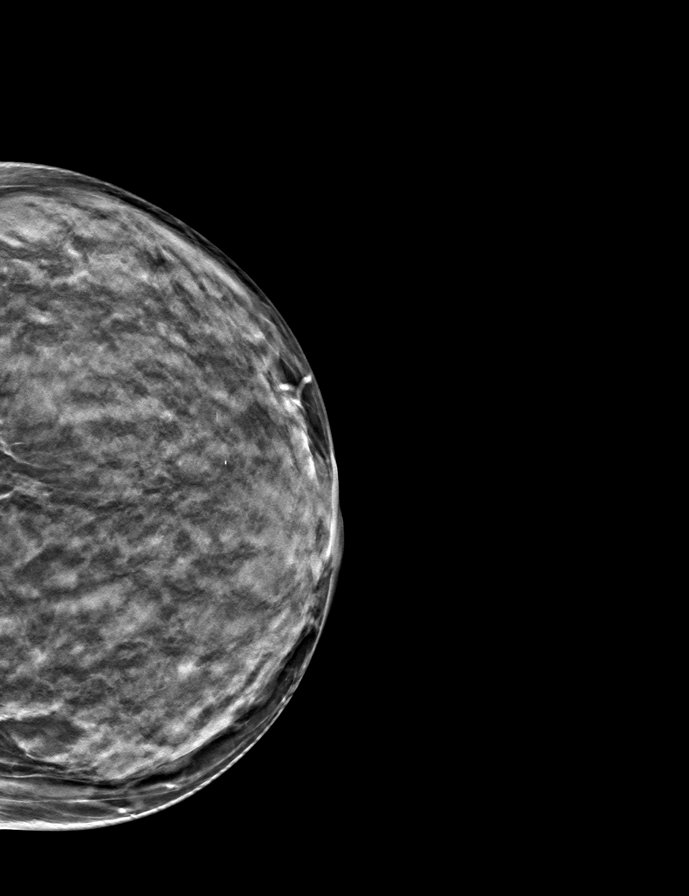

[L MLO tomo · tomo slice 27/54.0]
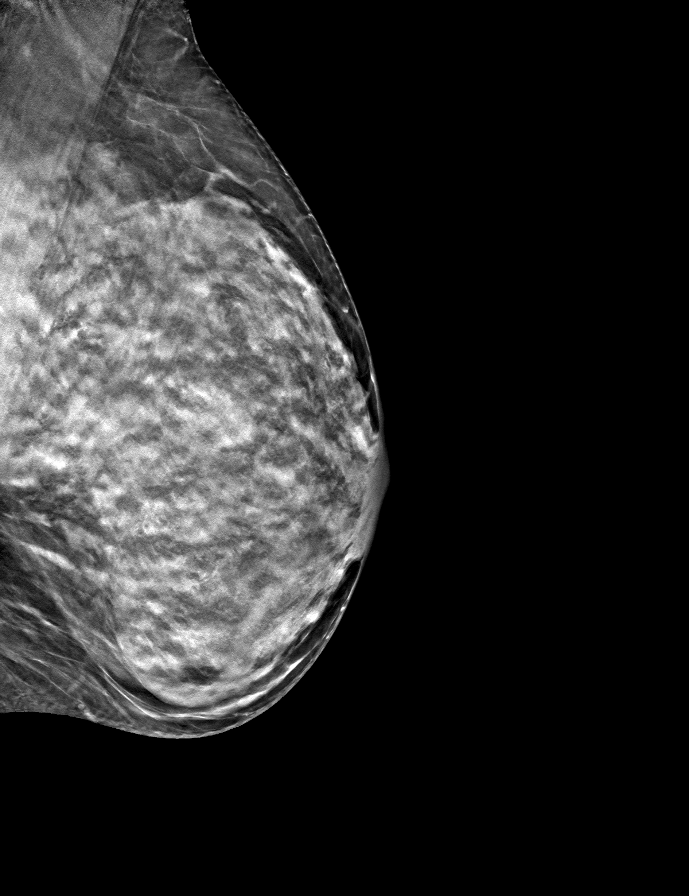

[R CC tomo · tomo slice 27/53.0]
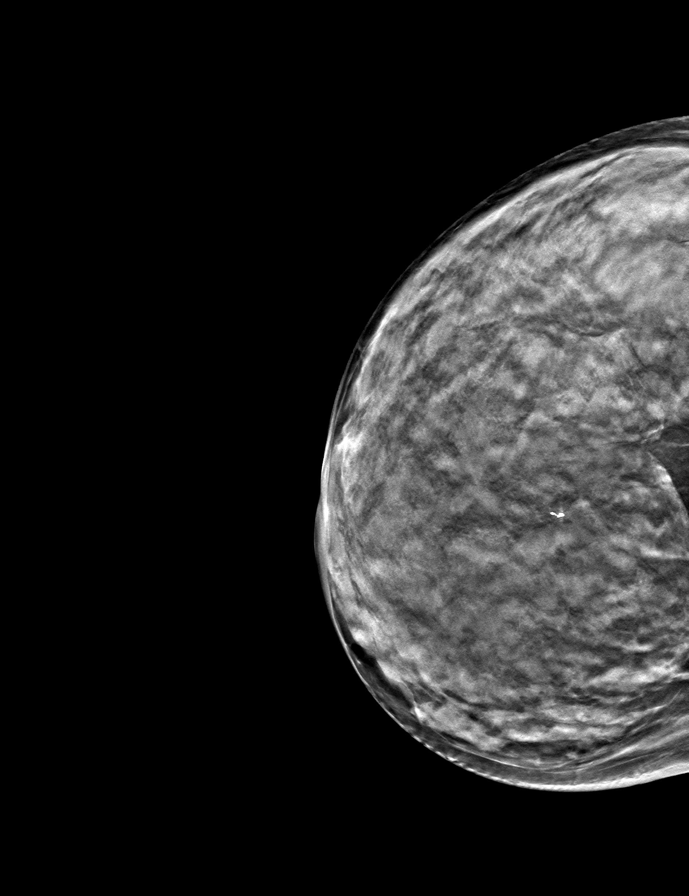

[R MLO tomo · tomo slice 25/50.0]
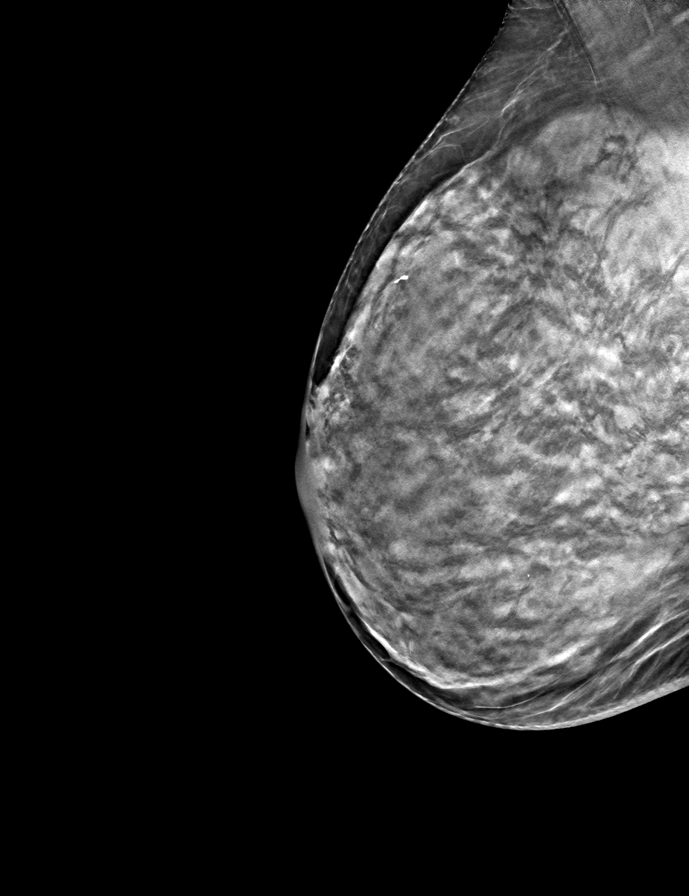

[9 of 24 positions shown; findings below may reference images not displayed]

ACR Breast Density Category c: The breast tissue is heterogeneously
dense, which may obscure small masses.
FINDINGS: There are no findings suspicious for malignancy.
IMPRESSION: No mammographic evidence of malignancy. A result letter of this
screening mammogram will be mailed directly to the patient.

RECOMMENDATION:
Screening mammogram in one year. (Code:Q3-W-BC3)

BI-RADS CATEGORY  1: Negative.

## 2021-12-31 ENCOUNTER — Encounter: Payer: Self-pay | Admitting: Internal Medicine

## 2021-12-31 ENCOUNTER — Other Ambulatory Visit: Payer: Self-pay | Admitting: Internal Medicine

## 2021-12-31 DIAGNOSIS — Z780 Asymptomatic menopausal state: Secondary | ICD-10-CM

## 2021-12-31 DIAGNOSIS — Z1231 Encounter for screening mammogram for malignant neoplasm of breast: Secondary | ICD-10-CM

## 2022-01-26 ENCOUNTER — Ambulatory Visit: Payer: BC Managed Care – PPO

## 2022-01-27 ENCOUNTER — Encounter: Payer: Self-pay | Admitting: Emergency Medicine

## 2022-01-27 ENCOUNTER — Ambulatory Visit (INDEPENDENT_AMBULATORY_CARE_PROVIDER_SITE_OTHER): Payer: BC Managed Care – PPO

## 2022-01-27 ENCOUNTER — Ambulatory Visit
Admission: EM | Admit: 2022-01-27 | Discharge: 2022-01-27 | Disposition: A | Discharge status: Home / Self Care | Payer: BC Managed Care – PPO | Attending: Emergency Medicine | Admitting: Emergency Medicine

## 2022-01-27 DIAGNOSIS — R058 Other specified cough: Secondary | ICD-10-CM

## 2022-01-27 DIAGNOSIS — Z0389 Encounter for observation for other suspected diseases and conditions ruled out: Secondary | ICD-10-CM | POA: Diagnosis not present

## 2022-01-27 DIAGNOSIS — J209 Acute bronchitis, unspecified: Secondary | ICD-10-CM

## 2022-01-27 MED ORDER — AZITHROMYCIN 250 MG PO TABS: 250.0000 mg | ORAL_TABLET | Freq: Every day | ORAL | 0 refills | Status: DC

## 2022-01-27 MED ORDER — PREDNISONE 10 MG PO TABS: 40.0000 mg | ORAL_TABLET | Freq: Every day | ORAL | 0 refills | Status: AC

## 2022-01-27 NOTE — ED Triage Notes (Signed)
Reports uri symptoms x 1 week.  Reports cough and chest tightness.  Has a slight runny nose.  Patient coughing in treatment room-congested cough.  Reports a pale yellow color to phlegm, intermittently.  Denies fever.    Covid test done yesterday and negative.   Was with daughter in ED for bronchitis/asthma yesterday.    Patient has taken cough medicine-thinks promethazine, mucinex, coricidin medication

## 2022-01-27 NOTE — Discharge Instructions (Addendum)
Take the prednisone and Zithromax as directed.  Follow up with your primary care provider.    

## 2022-01-27 NOTE — ED Provider Notes (Signed)
[name]    CSN: [id number] Arrival date & time: 01/27/22  1025      History   Chief Complaint Chief Complaint  Patient presents with   Cough    HPI Katie Martinez is a 61 y.o. female.  Patient presents with 10-day history of runny nose, congestion, cough, chest tightness.  Cough is productive of yellow sputum.  Negative COVID test at home.  Treatment attempted with cough and cold medications.  No fever, rash, shortness of breath, vomiting, diarrhea, or other symptoms.  Her medical history includes hypertension.  The history is provided by the patient and medical records.    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      Home Medications    Prior to Admission medications   Medication Sig Start Date End Date Taking? Authorizing Provider  azithromycin (ZITHROMAX) 250 MG tablet Take 1 tablet (250 mg total) by mouth daily. Take first 2 tablets together, then 1 every day until finished. 01/27/22  Yes Mickie Bail, NP  predniSONE (DELTASONE) 10 MG tablet Take 4 tablets (40 mg total) by mouth daily for 3 days. 01/27/22 01/30/22 Yes Mickie Bail, NP  amLODipine (NORVASC) 5 MG tablet Take 1 tablet (5 mg total) by mouth daily. 08/06/21   Lorre Munroe, NP  Cholecalciferol (VITAMIN D) 50 MCG (2000  UT) CAPS Take by mouth.    [provider]  LORazepam (ATIVAN) 0.5 MG tablet Take 1 tablet (0.5 mg total) by mouth daily as needed for anxiety. 07/23/21   Lorre Munroe, NP  Multiple Vitamin (MULTIVITAMIN) tablet Take 1 tablet by mouth daily.    [provider]  olmesartan (BENICAR) 20 MG tablet TAKE 1 TABLET BY MOUTH EVERY DAY 11/07/21   Lorre Munroe, NP  ondansetron (ZOFRAN ODT) 4 MG disintegrating tablet Take 1 tablet (4 mg total) by mouth every 8 (eight) hours as needed for nausea or vomiting. Patient not taking: Reported on 01/27/2022 11/18/20   Cheryll Cockayne, MD  triamcinolone cream (KENALOG) 0.1 % Apply 1 Application topically 3 (three) times daily as needed (areas of skin irritation). 10/14/21   Bing Neighbors, FNP  vitamin C (ASCORBIC ACID) 250 MG tablet Take 250 mg by mouth daily.    [provider]    Family History Family History  Problem Relation Age of Onset   Lymphoma Mother    Hypertension Mother    Hyperlipidemia Mother    Arthritis Father    Hyperlipidemia Father    Hypertension Father    Stroke Father    Hyperlipidemia Brother    Hypertension Brother    Prostate cancer Brother    Skin cancer Brother    Hypertension Brother    Hyperlipidemia Brother    Prostate cancer Brother  Skin cancer Brother    Arthritis Maternal Grandmother    Lymphoma Maternal Grandmother    Stroke Maternal Grandmother    Brain cancer Maternal Grandfather    Hypertension Maternal Grandfather    Hyperlipidemia Maternal Grandfather    Heart disease Maternal Grandfather    Heart disease Paternal Grandmother    Hyperlipidemia Paternal Grandmother    Hypertension Paternal Grandmother    Stroke Paternal Grandfather    Arthritis Paternal Grandfather    Breast cancer Maternal Aunt    Breast cancer Maternal Aunt     Social History Social History   Tobacco Use   Smoking status: Never   Smokeless tobacco: Never  Vaping Use   Vaping Use: Never used   Substance Use Topics   Alcohol use: Yes    Comment: 2 glasses at night   Drug use: Never     Allergies   Lisinopril and Metoprolol   Review of Systems Review of Systems  Constitutional:  Negative for chills and fever.  HENT:  Positive for congestion, postnasal drip and rhinorrhea. Negative for ear pain and sore throat.   Respiratory:  Positive for cough and chest tightness. Negative for shortness of breath and wheezing.   Cardiovascular:  Negative for chest pain and palpitations.  Gastrointestinal:  Negative for diarrhea and vomiting.  Skin:  Negative for rash.  All other systems reviewed and are negative.    Physical Exam Triage Vital Signs ED Triage Vitals  Enc Vitals Group     BP 01/27/22 1103 121/81     Pulse Rate 01/27/22 1049 94     Resp 01/27/22 1049 18     Temp 01/27/22 1049 98.2 F (36.8 C)     Temp src --      SpO2 01/27/22 1049 95 %     Weight --      Height --      Head Circumference --      Peak Flow --      Pain Score 01/27/22 1059 0     Pain Loc --      Pain Edu? --      Excl. in GC? --    No data found.  Updated Vital Signs BP 121/81 (BP Location: Left Arm)   Pulse 94   Temp 98.2 F (36.8 C)   Resp 18   SpO2 95%   Visual Acuity Right Eye Distance:   Left Eye Distance:   Bilateral Distance:    Right Eye Near:   Left Eye Near:    Bilateral Near:     Physical Exam Vitals and nursing note reviewed.  Constitutional:      General: She is not in acute distress.    Appearance: Normal appearance. She is well-developed. She is not ill-appearing.  HENT:     Right Ear: Tympanic membrane normal.     Left Ear: Tympanic membrane normal.     Nose: Nose normal.     Mouth/Throat:     Mouth: Mucous membranes are moist.     Pharynx: Oropharynx is clear.  Cardiovascular:     Rate and Rhythm: Normal rate and regular rhythm.     Heart sounds: Normal heart sounds.  Pulmonary:     Effort: Pulmonary effort is normal. No respiratory distress.      Breath sounds: Normal breath sounds.  Musculoskeletal:     Cervical back: Neck supple.  Skin:    General: Skin is warm and dry.  Neurological:     Mental Status: She  is alert.  Psychiatric:        Mood and Affect: Mood normal.        Behavior: Behavior normal.      UC Treatments / Results  Labs (all labs ordered are listed, but only abnormal results are displayed) Labs Reviewed - No data to display  EKG   Radiology DG Chest 2 View  Result Date: 01/27/2022 CLINICAL DATA:  URI symptoms for 1 week. EXAM: CHEST - 2 VIEW COMPARISON:  None Available. FINDINGS: The cardiomediastinal silhouette is normal. There is no focal consolidation or pulmonary edema. There is no pleural effusion or pneumothorax There is no acute osseous abnormality. Cholecystectomy clips are noted. IMPRESSION: No radiographic evidence of acute cardiopulmonary process. Electronically Signed   By: Lesia Hausen M.D.   On: 01/27/2022 11:45    Procedures Procedures (including critical care time)  Medications Ordered in UC Medications - No data to display  Initial Impression / Assessment and Plan / UC Course  I have reviewed the triage vital signs and the nursing notes.  Pertinent labs & imaging results that were available during my care of the patient were reviewed by me and considered in my medical decision making (see chart for details).    Productive cough, acute bronchitis.  Chest x-ray negative.  Afebrile and vital signs are stable.  Treating with Zithromax and prednisone.  Education provided on acute bronchitis.  Instructed patient to follow-up with her PCP.  She agrees to plan of care.  Final Clinical Impressions(s) / UC Diagnoses   Final diagnoses:  Productive cough  Acute bronchitis, unspecified organism     Discharge Instructions      Take the prednisone and Zithromax as directed.  Follow up with your primary care provider.        ED Prescriptions     Medication Sig Dispense Auth.  Provider   predniSONE (DELTASONE) 10 MG tablet Take 4 tablets (40 mg total) by mouth daily for 3 days. 12 tablet Mickie Bail, NP   azithromycin (ZITHROMAX) 250 MG tablet Take 1 tablet (250 mg total) by mouth daily. Take first 2 tablets together, then 1 every day until finished. 6 tablet Mickie Bail, NP      PDMP not reviewed this encounter.   Mickie Bail, NP 01/27/22 1156

## 2022-01-30 ENCOUNTER — Other Ambulatory Visit: Payer: Self-pay | Admitting: Internal Medicine

## 2022-01-30 NOTE — Telephone Encounter (Signed)
Requested Prescriptions  Pending Prescriptions Disp Refills   amLODipine (NORVASC) 5 MG tablet [Pharmacy Med Name: AMLODIPINE BESYLATE 5 MG TAB] 90 tablet 0    Sig: TAKE 1 TABLET (5 MG TOTAL) BY MOUTH DAILY.     Cardiovascular: Calcium Channel Blockers 2 Passed - 01/30/2022  2:45 AM      Passed - Last BP in normal range    BP Readings from Last 1 Encounters:  01/27/22 121/81         Passed - Last Heart Rate in normal range    Pulse Readings from Last 1 Encounters:  01/27/22 94         Passed - Valid encounter within last 6 months    Recent Outpatient Visits           5 months ago Primary hypertension   [facility] [place], [place], NP   8 months ago Pain and swelling of left ankle   [facility] [place], Salvadore [place], NP   9 months ago Chest tightness   [facility] [place], Salvadore Oxford, NP   11 months ago Encounter for general adult medical examination with abnormal findings   [facility], Salvadore Oxford, NP       Future Appointments             In 1 week Sampson Si, Salvadore Oxford, NP [facility], [facility]

## 2022-02-05 ENCOUNTER — Other Ambulatory Visit: Payer: Self-pay | Admitting: Internal Medicine

## 2022-02-05 NOTE — Telephone Encounter (Signed)
Requested Prescriptions  Pending Prescriptions Disp Refills   olmesartan (BENICAR) 20 MG tablet [Pharmacy Med Name: OLMESARTAN MEDOXOMIL 20 MG TAB] 90 tablet 0    Sig: TAKE 1 TABLET BY MOUTH EVERY DAY     Cardiovascular:  Angiotensin Receptor Blockers Failed - 02/05/2022  1:46 AM      Failed - Cr in normal range and within 180 days    Creat  Date Value Ref Range Status  02/13/2021 0.65 0.50 - 1.05 mg/dL Final         Failed - K in normal range and within 180 days    Potassium  Date Value Ref Range Status  02/13/2021 3.7 3.5 - 5.3 mmol/L Final         Failed - Valid encounter within last 6 months    Recent Outpatient Visits           6 months ago Primary hypertension   [facility] [place], Salvadore Oxford, NP   8 months ago Pain and swelling of left ankle   [facility] [place], Salvadore Oxford, NP   10 months ago Chest tightness   [facility] [place], Salvadore Oxford, NP   11 months ago Encounter for general adult medical examination with abnormal findings   [facility] [place], Salvadore Oxford, NP       Future Appointments             In 5 days Sampson Si, Salvadore Oxford, NP [facility], [facility]            Passed - Patient is not pregnant      Passed - Last BP in normal range    BP Readings from Last 1 Encounters:  01/27/22 121/81

## 2022-02-10 ENCOUNTER — Ambulatory Visit (INDEPENDENT_AMBULATORY_CARE_PROVIDER_SITE_OTHER): Payer: BC Managed Care – PPO | Admitting: Internal Medicine

## 2022-02-10 ENCOUNTER — Encounter: Payer: Self-pay | Admitting: Internal Medicine

## 2022-02-10 VITALS — BP 124/82 | HR 71 | Temp 96.8°F | Wt 109.0 lb

## 2022-02-10 DIAGNOSIS — Z23 Encounter for immunization: Secondary | ICD-10-CM

## 2022-02-10 DIAGNOSIS — Z0000 Encounter for general adult medical examination without abnormal findings: Secondary | ICD-10-CM | POA: Diagnosis not present

## 2022-02-10 DIAGNOSIS — J209 Acute bronchitis, unspecified: Secondary | ICD-10-CM | POA: Diagnosis not present

## 2022-02-10 DIAGNOSIS — R058 Other specified cough: Secondary | ICD-10-CM

## 2022-02-10 DIAGNOSIS — M940 Chondrocostal junction syndrome [Tietze]: Secondary | ICD-10-CM

## 2022-02-10 DIAGNOSIS — E7800 Pure hypercholesterolemia, unspecified: Secondary | ICD-10-CM | POA: Diagnosis not present

## 2022-02-10 MED ORDER — CETIRIZINE HCL 10 MG PO TABS: 10.0000 mg | ORAL_TABLET | Freq: Every day | ORAL | 0 refills | Status: DC

## 2022-02-10 NOTE — Progress Notes (Signed)
Subjective:    Patient ID: Katie Martinez, female    DOB: [birth year], 61 y.o.   MRN: [id number]  HPI  Patient presents to clinic today for urgent care follow-up.  She presented to urgent care 11/27 with complaint of URI symptoms.  Her chest x-ray at that time was normal.  She was diagnosed with bronchitis, treated with Azithromycin and Prednisone.  Since that time, she reports persistent cough. The cough is dry and non productive. She also reports right side rib pain. She has tried Delsym, Corcidin, Mucinex Cold and Flu with some relief of symptoms.  Review of Systems     Past Medical History:  Diagnosis Date   Hyperlipidemia    Hypertension     Current Outpatient Medications  Medication Sig Dispense Refill   amLODipine (NORVASC) 5 MG tablet TAKE 1 TABLET (5 MG TOTAL) BY MOUTH DAILY. 90 tablet 0   azithromycin (ZITHROMAX) 250 MG tablet Take 1 tablet (250 mg total) by mouth daily. Take first 2 tablets together, then 1 every day until finished. 6 tablet 0   Cholecalciferol (VITAMIN D) 50 MCG (2000 UT) CAPS Take by mouth.     LORazepam (ATIVAN) 0.5 MG tablet Take 1 tablet (0.5 mg total) by mouth daily as needed for anxiety. 20 tablet 0   Multiple Vitamin (MULTIVITAMIN) tablet Take 1 tablet by mouth daily.     olmesartan (BENICAR) 20 MG tablet TAKE 1 TABLET BY MOUTH EVERY DAY 90 tablet 0   ondansetron (ZOFRAN ODT) 4 MG disintegrating tablet Take 1 tablet (4 mg total) by mouth every 8 (eight) hours as needed for nausea or vomiting. (Patient not taking: Reported on 01/27/2022) 20 tablet 0   triamcinolone cream (KENALOG) 0.1 % Apply 1 Application topically 3 (three) times daily as needed (areas of skin irritation). 120 g 0   vitamin C (ASCORBIC ACID) 250 MG tablet Take 250 mg by mouth daily.     No current facility-administered medications for this visit.    Allergies  Allergen Reactions   Lisinopril Other (See Comments)    Dry cough     Metoprolol Other (See Comments)     Vivid dreams, dizziness    Family History  Problem Relation Age of Onset   Lymphoma Mother    Hypertension Mother    Hyperlipidemia Mother    Arthritis Father    Hyperlipidemia Father    Hypertension Father    Stroke Father    Hyperlipidemia Brother    Hypertension Brother    Prostate cancer Brother    Skin cancer Brother    Hypertension Brother    Hyperlipidemia Brother    Prostate cancer Brother    Skin cancer Brother    Arthritis Maternal Grandmother    Lymphoma Maternal Grandmother    Stroke Maternal Grandmother    Brain cancer Maternal Grandfather    Hypertension Maternal Grandfather    Hyperlipidemia Maternal Grandfather    Heart disease Maternal Grandfather    Heart disease Paternal Grandmother    Hyperlipidemia Paternal Grandmother    Hypertension Paternal Grandmother    Stroke Paternal Grandfather    Arthritis Paternal Grandfather    Breast cancer Maternal Aunt    Breast cancer Maternal Aunt     Social History   Socioeconomic History   Marital status: Married    Spouse name: Not on file   Number of children: Not on file   Years of education: Not on file   Highest education level: Not on file  Occupational History  Not on file  Tobacco Use   Smoking status: Never   Smokeless tobacco: Never  Vaping Use   Vaping Use: Never used  Substance and Sexual Activity   Alcohol use: Yes    Comment: 2 glasses at night   Drug use: Never   Sexual activity: Not on file  Other Topics Concern   Not on file  Social History Narrative   Not on file   Social Determinants of Health   Financial Resource Strain: Not on file  Food Insecurity: Not on file  Transportation Needs: Not on file  Physical Activity: Not on file  Stress: Not on file  Social Connections: Not on file  Intimate Partner Violence: Not on file     Constitutional: Denies fever, malaise, fatigue, headache or abrupt weight changes.  HEENT: Denies eye pain, eye redness, ear pain, ringing in the  ears, wax buildup, runny nose, nasal congestion, bloody nose, or sore throat. Respiratory: Patient reports cough.  Denies difficulty breathing, shortness of breath.   Cardiovascular: Denies chest pain, chest tightness, palpitations or swelling in the hands or feet.  Gastrointestinal: Denies abdominal pain, bloating, constipation, diarrhea or blood in the stool.  Musculoskeletal: Patient reports right-sided rib pain.  Denies decrease in range of motion, difficulty with gait, muscle pain or joint swelling.  Skin: Denies redness, rashes, lesions or ulcercations.   No other specific complaints in a complete review of systems (except as listed in HPI above).  Objective:   Physical Exam  BP 124/82 (BP Location: Left Arm, Patient Position: Sitting, Cuff Size: Normal)   Pulse 71   Temp (!) 96.8 F (36 C) (Temporal)   Wt 109 lb (49.4 kg)   SpO2 99%   BMI 21.29 kg/m   Wt Readings from Last 3 Encounters:  02/10/22 109 lb (49.4 kg)  08/06/21 105 lb (47.6 kg)  05/14/21 105 lb (47.6 kg)    General: Appears her stated age, well developed, well nourished in NAD. Skin: Warm, dry and intact. No rashes noted. HEENT: Head: normal shape and size, no sinus tenderness noted; Eyes: sclera white, no icterus, conjunctiva pink, PERRLA and EOMs intact; Throat/Mouth: Teeth present, mucosa pink and moist, + PND, no exudate, lesions or ulcerations noted.  Neck: No adenopathy noted. Cardiovascular: Normal rate and rhythm. S1,S2 noted.  No murmur, rubs or gallops noted.  Pulmonary/Chest: Normal effort and positive vesicular breath sounds. No respiratory distress. No wheezes, rales or ronchi noted.  Musculoskeletal: Pain with palpation over the right anterior intercostal space.  No difficulty with gait.  Neurological: Alert and oriented.     BMET    Component Value Date/Time   NA 139 02/13/2021 0822   K 3.7 02/13/2021 0822   CL 100 02/13/2021 0822   CO2 29 02/13/2021 0822   GLUCOSE 81 02/13/2021 0822    BUN 10 02/13/2021 0822   CREATININE 0.65 02/13/2021 0822   CALCIUM 9.4 02/13/2021 0822   GFRNONAA >60 11/18/2020 1523    Lipid Panel     Component Value Date/Time   CHOL 222 (H) 08/02/2021 0820   TRIG 107 08/02/2021 0820   HDL 71 08/02/2021 0820   CHOLHDL 3.1 08/02/2021 0820   VLDL 30.0 02/08/2020 1541   LDLCALC 130 (H) 08/02/2021 0820    CBC    Component Value Date/Time   WBC 7.5 02/13/2021 0822   RBC 4.07 02/13/2021 0822   HGB 13.6 02/13/2021 0822   HCT 39.8 02/13/2021 0822   PLT 230 02/13/2021 0822   MCV 97.8  02/13/2021 0822   MCH 33.4 (H) 02/13/2021 0822   MCHC 34.2 02/13/2021 0822   RDW 11.8 02/13/2021 0822   LYMPHSABS 1.2 11/18/2020 1523   MONOABS 0.5 11/18/2020 1523   EOSABS 0.0 11/18/2020 1523   BASOSABS 0.0 11/18/2020 1523    Hgb A1C No results found for: "HGBA1C"         Assessment & Plan:   Urgent Care Follow-Up for Acute Bronchitis, Post Viral Cough, Costochondritis:  Urgent care notes and imaging reviewed Rx for Zyrtec 10 mg daily x 2 weeks Can take Ibuprofen or Aleve OTC as needed for rib pain Advised her that this cough and rib pain can last for up to 4 to 6 weeks and should resolve with time  Schedule an appointment for your annual exam Nicki Reaper, NP

## 2022-02-10 NOTE — Patient Instructions (Signed)
Costochondritis  Costochondritis is irritation and swelling (inflammation) of the tissue that connects the ribs to the breastbone (sternum). This tissue is called cartilage. This condition causes pain in the front of the chest. The pain often starts slowly. It may be in more than one rib. What are the causes? The cause of this condition is not always known. It can come from stress on the sternum. The cause of this stress could be: Chest injury. Exercise or activity. This may include lifting. Very bad coughing. What increases the risk? Being female. Being 30-40 years old. Starting a new exercise or work activity. Having low levels of vitamin D. Having a condition that makes you cough a lot. What are the signs or symptoms? Chest pain that: Starts slowly. It can be sharp or dull. Gets worse with deep breathing, coughing, or exercise. Gets better with rest. May be worse when you press on your ribs and breastbone. How is this treated? In most cases, this condition goes away on its own over time. You may need to take an NSAID, such as ibuprofen. This can help reduce pain. You may also need to: Rest and stay away from activities that make pain worse. Put heat or ice on the area that hurts. Do exercises to stretch your chest muscles. If these treatments do not help, your doctor may inject a medicine to numb the area. This can help relieve the pain. Follow these instructions at home: Managing pain, stiffness, and swelling     If told, put ice on the painful area. To do this: Put ice in a plastic bag. Place a towel between your skin and the bag. Leave the ice on for 20 minutes, 2-3 times a day. If told, put heat on the affected area. Do this as often as told by your doctor. Use the heat source that your doctor recommends, such as a moist heat pack or a heating pad. Place a towel between your skin and the heat source. Leave the heat on for 20-30 minutes. If your skin turns bright red,  take off the ice or heat right away to prevent skin damage. The risk of skin damage is higher if you cannot feel pain, heat, or cold. Activity Rest as told by your doctor. Do not do things that make your pain worse. This includes activities that use your chest, belly (abdomen), and side muscles. You may have to avoid lifting. Ask your doctor how much you can safely lift. Return to your normal activities when your doctor says that it is safe. General instructions Take over-the-counter and prescription medicines only as told by your doctor. Contact a doctor if: You have chills or a fever. Your pain does not go away or gets worse. You have a cough that does not go away. Get help right away if: You have a hard time breathing. You have very bad chest pain that does not get better with medicines, heat, or ice. These symptoms may be an emergency. Get help right away. Call 911. Do not wait to see if the symptoms will go away. Do not drive yourself to the hospital. This information is not intended to replace advice given to you by your health care provider. Make sure you discuss any questions you have with your health care provider. Document Revised: 09/05/2021 Document Reviewed: 09/05/2021 Elsevier Patient Education  2023 [employer]  

## 2022-02-11 LAB — CBC
HCT: 37 % (ref 35.0–45.0)
Hemoglobin: 12.8 g/dL (ref 11.7–15.5)
MCH: 33.2 pg — ABNORMAL HIGH (ref 27.0–33.0)
MCHC: 34.6 g/dL (ref 32.0–36.0)
MCV: 95.9 fL (ref 80.0–100.0)
MPV: 9.9 fL (ref 7.5–12.5)
Platelets: 269 10*3/uL (ref 140–400)
RBC: 3.86 10*6/uL (ref 3.80–5.10)
RDW: 11.3 % (ref 11.0–15.0)
WBC: 4.9 10*3/uL (ref 3.8–10.8)

## 2022-02-11 LAB — COMPLETE METABOLIC PANEL WITH GFR
AG Ratio: 1.6 (calc) (ref 1.0–2.5)
ALT: 23 U/L (ref 6–29)
AST: 20 U/L (ref 10–35)
Albumin: 4.4 g/dL (ref 3.6–5.1)
Alkaline phosphatase (APISO): 49 U/L (ref 37–153)
BUN: 10 mg/dL (ref 7–25)
CO2: 28 mmol/L (ref 20–32)
Calcium: 9.1 mg/dL (ref 8.6–10.4)
Chloride: 105 mmol/L (ref 98–110)
Creat: 0.59 mg/dL (ref 0.50–1.05)
Globulin: 2.7 g/dL (calc) (ref 1.9–3.7)
Glucose, Bld: 83 mg/dL (ref 65–99)
Potassium: 4.3 mmol/L (ref 3.5–5.3)
Sodium: 141 mmol/L (ref 135–146)
Total Bilirubin: 0.5 mg/dL (ref 0.2–1.2)
Total Protein: 7.1 g/dL (ref 6.1–8.1)
eGFR: 102 mL/min/{1.73_m2} (ref >=60)

## 2022-02-11 LAB — TEST AUTHORIZATION: TEST CODE:: 7600

## 2022-02-11 LAB — LIPID PANEL
Cholesterol: 232 mg/dL — ABNORMAL HIGH (ref <200)
HDL: 69 mg/dL (ref >=50)
LDL Cholesterol (Calc): 141 mg/dL (calc) — ABNORMAL HIGH
Non-HDL Cholesterol (Calc): 163 mg/dL (calc) — ABNORMAL HIGH (ref <130)
Total CHOL/HDL Ratio: 3.4 (calc) (ref <5.0)
Triglycerides: 102 mg/dL (ref <150)

## 2022-02-12 ENCOUNTER — Encounter: Payer: Self-pay | Admitting: Internal Medicine

## 2022-02-17 ENCOUNTER — Encounter: Payer: Self-pay | Admitting: Internal Medicine

## 2022-02-17 ENCOUNTER — Other Ambulatory Visit (HOSPITAL_COMMUNITY)
Admission: RE | Admit: 2022-02-17 | Discharge: 2022-02-17 | Disposition: A | Discharge status: Home / Self Care | Payer: BC Managed Care – PPO | Source: Ambulatory Visit | Attending: Internal Medicine | Admitting: Internal Medicine

## 2022-02-17 ENCOUNTER — Ambulatory Visit (INDEPENDENT_AMBULATORY_CARE_PROVIDER_SITE_OTHER): Payer: BC Managed Care – PPO | Admitting: Internal Medicine

## 2022-02-17 VITALS — BP 114/60 | HR 75 | Temp 97.3°F | Ht 60.0 in | Wt 110.0 lb

## 2022-02-17 DIAGNOSIS — E7800 Pure hypercholesterolemia, unspecified: Secondary | ICD-10-CM

## 2022-02-17 DIAGNOSIS — Z124 Encounter for screening for malignant neoplasm of cervix: Secondary | ICD-10-CM | POA: Insufficient documentation

## 2022-02-17 DIAGNOSIS — Z0000 Encounter for general adult medical examination without abnormal findings: Secondary | ICD-10-CM

## 2022-02-17 MED ORDER — LOVASTATIN 10 MG PO TABS: 10.0000 mg | ORAL_TABLET | ORAL | 1 refills | Status: DC

## 2022-02-17 NOTE — Progress Notes (Signed)
Subjective:    Patient ID: Katie Martinez, female    DOB: [birth year], 61 y.o.   MRN: [id number]  HPI  Patient presents to clinic today for her annual exam.  Flu: 01/2022 Tetanus: 05/2011 but she thinks it was more like 6 years ago COVID: [place] x 2 Shingrix: 09/2018, 12/2018 Pap smear: > 5 years ago Mammogram: 01/2021, scheduled 01/2022 Bone density: Scheduled for/2024 Colon screening: 01/2021 Vision screening: annually Dentist: biannually  Diet: She does eat meat. She consumes fruits and veggies. She tries to avoid fried foods. She drinks mostly water, sweet tea. Exercise: None  Review of Systems     Past Medical History:  Diagnosis Date   Hyperlipidemia    Hypertension     Current Outpatient Medications  Medication Sig Dispense Refill   amLODipine (NORVASC) 5 MG tablet TAKE 1 TABLET (5 MG TOTAL) BY MOUTH DAILY. 90 tablet 0   azithromycin (ZITHROMAX) 250 MG tablet Take 1 tablet (250 mg total) by mouth daily. Take first 2 tablets together, then 1 every day until finished. 6 tablet 0   cetirizine (ZYRTEC) 10 MG tablet Take 1 tablet (10 mg total) by mouth daily. 14 tablet 0   Cholecalciferol (VITAMIN D) 50 MCG (2000 UT) CAPS Take by mouth.     LORazepam (ATIVAN) 0.5 MG tablet Take 1 tablet (0.5 mg total) by mouth daily as needed for anxiety. 20 tablet 0   Multiple Vitamin (MULTIVITAMIN) tablet Take 1 tablet by mouth daily.     olmesartan (BENICAR) 20 MG tablet TAKE 1 TABLET BY MOUTH EVERY DAY 90 tablet 0   ondansetron (ZOFRAN ODT) 4 MG disintegrating tablet Take 1 tablet (4 mg total) by mouth every 8 (eight) hours as needed for nausea or vomiting. 20 tablet 0   triamcinolone cream (KENALOG) 0.1 % Apply 1 Application topically 3 (three) times daily as needed (areas of skin irritation). 120 g 0   vitamin C (ASCORBIC ACID) 250 MG tablet Take 250 mg by mouth daily.     No current facility-administered medications for this visit.    Allergies  Allergen Reactions    Lisinopril Other (See Comments)    Dry cough     Metoprolol Other (See Comments)    Vivid dreams, dizziness    Family History  Problem Relation Age of Onset   Lymphoma Mother    Hypertension Mother    Hyperlipidemia Mother    Arthritis Father    Hyperlipidemia Father    Hypertension Father    Stroke Father    Hyperlipidemia Brother    Hypertension Brother    Prostate cancer Brother    Skin cancer Brother    Hypertension Brother    Hyperlipidemia Brother    Prostate cancer Brother    Skin cancer Brother    Arthritis Maternal Grandmother    Lymphoma Maternal Grandmother    Stroke Maternal Grandmother    Brain cancer Maternal Grandfather    Hypertension Maternal Grandfather    Hyperlipidemia Maternal Grandfather    Heart disease Maternal Grandfather    Heart disease Paternal Grandmother    Hyperlipidemia Paternal Grandmother    Hypertension Paternal Grandmother    Stroke Paternal Grandfather    Arthritis Paternal Grandfather    Breast cancer Maternal Aunt    Breast cancer Maternal Aunt     Social History   Socioeconomic History   Marital status: Married    Spouse name: Not on file   Number of children: Not on file   Years of education:  Not on file   Highest education level: Not on file  Occupational History   Not on file  Tobacco Use   Smoking status: Never   Smokeless tobacco: Never  Vaping Use   Vaping Use: Never used  Substance and Sexual Activity   Alcohol use: Yes    Comment: 2 glasses at night   Drug use: Never   Sexual activity: Not on file  Other Topics Concern   Not on file  Social History Narrative   Not on file   Social Determinants of Health   Financial Resource Strain: Not on file  Food Insecurity: Not on file  Transportation Needs: Not on file  Physical Activity: Not on file  Stress: Not on file  Social Connections: Not on file  Intimate Partner Violence: Not on file     Constitutional: Denies fever, malaise, fatigue, headache  or abrupt weight changes.  HEENT: Denies eye pain, eye redness, ear pain, ringing in the ears, wax buildup, runny nose, nasal congestion, bloody nose, or sore throat. Respiratory: Denies difficulty breathing, shortness of breath, cough or sputum production.   Cardiovascular: Denies chest pain, chest tightness, palpitations or swelling in the hands or feet.  Gastrointestinal: Pt reports gassiness. Denies abdominal pain, bloating, constipation, diarrhea or blood in the stool.  GU: Denies urgency, frequency, pain with urination, burning sensation, blood in urine, odor or discharge. Musculoskeletal: Pt reports right side rib pain. Denies decrease in range of motion, difficulty with gait, muscle pain or joint pain and swelling.  Skin: Denies redness, rashes, lesions or ulcercations.  Neurological: Denies dizziness, difficulty with memory, difficulty with speech or problems with balance and coordination.  Psych: Patient has a history of anxiety and depression.  Denies SI/HI.  No other specific complaints in a complete review of systems (except as listed in HPI above).  Objective:   Physical Exam  BP 114/60 (BP Location: Left Arm, Patient Position: Sitting, Cuff Size: Normal)   Pulse 75   Temp (!) 97.3 F (36.3 C) (Temporal)   Ht 5' (1.524 m)   Wt 110 lb (49.9 kg)   SpO2 100%   BMI 21.48 kg/m   Wt Readings from Last 3 Encounters:  02/10/22 109 lb (49.4 kg)  08/06/21 105 lb (47.6 kg)  05/14/21 105 lb (47.6 kg)    General: Appears her stated age, well developed, well nourished in NAD. Skin: Warm, dry and intact. No rashes noted. HEENT: Head: normal shape and size; Eyes: sclera white, no icterus, conjunctiva pink, PERRLA and EOMs intact;  Neck:  Neck supple, trachea midline. No masses, lumps or thyromegaly present.  Cardiovascular: Normal rate and rhythm. S1,S2 noted.  No murmur, rubs or gallops noted. No JVD or BLE edema. No carotid bruits noted. Pulmonary/Chest: Normal effort and  positive vesicular breath sounds. No respiratory distress. No wheezes, rales or ronchi noted.  Abdomen: Normal bowel sounds.  Pelvic: Normal female anatomy. Cervix without mass or lesion. No CMT. Adnexa non palpable. Musculoskeletal: Strength 5/5 BUE/BLE.  No difficulty with gait.  Neurological: Alert and oriented. Cranial nerves II-XII grossly intact. Coordination normal.  Psychiatric: Mood and affect normal. Behavior is normal. Judgment and thought content normal.    BMET    Component Value Date/Time   NA 141 02/10/2022 1005   K 4.3 02/10/2022 1005   CL 105 02/10/2022 1005   CO2 28 02/10/2022 1005   GLUCOSE 83 02/10/2022 1005   BUN 10 02/10/2022 1005   CREATININE 0.59 02/10/2022 1005   CALCIUM 9.1 02/10/2022  1005   GFRNONAA >60 11/18/2020 1523    Lipid Panel     Component Value Date/Time   CHOL 232 (H) 02/10/2022 1005   TRIG 102 02/10/2022 1005   HDL 69 02/10/2022 1005   CHOLHDL 3.4 02/10/2022 1005   VLDL 30.0 02/08/2020 1541   LDLCALC 141 (H) 02/10/2022 1005    CBC    Component Value Date/Time   WBC 4.9 02/10/2022 1005   RBC 3.86 02/10/2022 1005   HGB 12.8 02/10/2022 1005   HCT 37.0 02/10/2022 1005   PLT 269 02/10/2022 1005   MCV 95.9 02/10/2022 1005   MCH 33.2 (H) 02/10/2022 1005   MCHC 34.6 02/10/2022 1005   RDW 11.3 02/10/2022 1005   LYMPHSABS 1.2 11/18/2020 1523   MONOABS 0.5 11/18/2020 1523   EOSABS 0.0 11/18/2020 1523   BASOSABS 0.0 11/18/2020 1523    Hgb A1C No results found for: "HGBA1C"         Assessment & Plan:   Preventative Health Maintenance:  Flu shot UTD Tdap UTD per her report Encouraged her to get her COVID booster Shingrix UTD Pap smear today, she declines STD screening Mammogram scheduled Bone density scheduled Colon screening UTD Encouraged her to consume a balanced diet and exercise regimen Advised her to see an eye doctor and dentist annually Future c-Met and lipid profile ordered  RTC in 6 months, follow-up chronic  conditions Webb Silversmith, NP

## 2022-02-17 NOTE — Patient Instructions (Signed)
Health Maintenance for Postmenopausal Women Menopause is a normal process in which your ability to get pregnant comes to an end. This process happens slowly over many months or years, usually between the ages of 48 and 55. Menopause is complete when you have missed your menstrual period for 12 months. It is important to talk with your health care provider about some of the most common conditions that affect women after menopause (postmenopausal women). These include heart disease, cancer, and bone loss (osteoporosis). Adopting a healthy lifestyle and getting preventive care can help to promote your health and wellness. The actions you take can also lower your chances of developing some of these common conditions. What are the signs and symptoms of menopause? During menopause, you may have the following symptoms: Hot flashes. These can be moderate or severe. Night sweats. Decrease in sex drive. Mood swings. Headaches. Tiredness (fatigue). Irritability. Memory problems. Problems falling asleep or staying asleep. Talk with your health care provider about treatment options for your symptoms. Do I need hormone replacement therapy? Hormone replacement therapy is effective in treating symptoms that are caused by menopause, such as hot flashes and night sweats. Hormone replacement carries certain risks, especially as you become older. If you are thinking about using estrogen or estrogen with progestin, discuss the benefits and risks with your health care provider. How can I reduce my risk for heart disease and stroke? The risk of heart disease, heart attack, and stroke increases as you age. One of the causes may be a change in the body's hormones during menopause. This can affect how your body uses dietary fats, triglycerides, and cholesterol. Heart attack and stroke are medical emergencies. There are many things that you can do to help prevent heart disease and stroke. Watch your blood pressure High  blood pressure causes heart disease and increases the risk of stroke. This is more likely to develop in people who have high blood pressure readings or are overweight. Have your blood pressure checked: Every 3-5 years if you are 18-39 years of age. Every year if you are 40 years old or older. Eat a healthy diet  Eat a diet that includes plenty of vegetables, fruits, low-fat dairy products, and lean protein. Do not eat a lot of foods that are high in solid fats, added sugars, or sodium. Get regular exercise Get regular exercise. This is one of the most important things you can do for your health. Most adults should: Try to exercise for at least 150 minutes each week. The exercise should increase your heart rate and make you sweat (moderate-intensity exercise). Try to do strengthening exercises at least twice each week. Do these in addition to the moderate-intensity exercise. Spend less time sitting. Even light physical activity can be beneficial. Other tips Work with your health care provider to achieve or maintain a healthy weight. Do not use any products that contain nicotine or tobacco. These products include cigarettes, chewing tobacco, and vaping devices, such as e-cigarettes. If you need help quitting, ask your health care provider. Know your numbers. Ask your health care provider to check your cholesterol and your blood sugar (glucose). Continue to have your blood tested as directed by your health care provider. Do I need screening for cancer? Depending on your health history and family history, you may need to have cancer screenings at different stages of your life. This may include screening for: Breast cancer. Cervical cancer. Lung cancer. Colorectal cancer. What is my risk for osteoporosis? After menopause, you may be   at increased risk for osteoporosis. Osteoporosis is a condition in which bone destruction happens more quickly than new bone creation. To help prevent osteoporosis or  the bone fractures that can happen because of osteoporosis, you may take the following actions: If you are 19-50 years old, get at least 1,000 mg of calcium and at least 600 international units (IU) of vitamin D per day. If you are older than age 50 but younger than age 70, get at least 1,200 mg of calcium and at least 600 international units (IU) of vitamin D per day. If you are older than age 70, get at least 1,200 mg of calcium and at least 800 international units (IU) of vitamin D per day. Smoking and drinking excessive alcohol increase the risk of osteoporosis. Eat foods that are rich in calcium and vitamin D, and do weight-bearing exercises several times each week as directed by your health care provider. How does menopause affect my mental health? Depression may occur at any age, but it is more common as you become older. Common symptoms of depression include: Feeling depressed. Changes in sleep patterns. Changes in appetite or eating patterns. Feeling an overall lack of motivation or enjoyment of activities that you previously enjoyed. Frequent crying spells. Talk with your health care provider if you think that you are experiencing any of these symptoms. General instructions See your health care provider for regular wellness exams and vaccines. This may include: Scheduling regular health, dental, and eye exams. Getting and maintaining your vaccines. These include: Influenza vaccine. Get this vaccine each year before the flu season begins. Pneumonia vaccine. Shingles vaccine. Tetanus, diphtheria, and pertussis (Tdap) booster vaccine. Your health care provider may also recommend other immunizations. Tell your health care provider if you have ever been abused or do not feel safe at home. Summary Menopause is a normal process in which your ability to get pregnant comes to an end. This condition causes hot flashes, night sweats, decreased interest in sex, mood swings, headaches, or lack  of sleep. Treatment for this condition may include hormone replacement therapy. Take actions to keep yourself healthy, including exercising regularly, eating a healthy diet, watching your weight, and checking your blood pressure and blood sugar levels. Get screened for cancer and depression. Make sure that you are up to date with all your vaccines. This information is not intended to replace advice given to you by your health care provider. Make sure you discuss any questions you have with your health care provider. Document Revised: 07/09/2020 Document Reviewed: 07/09/2020 Elsevier Patient Education  2023 Elsevier Inc.  

## 2022-02-20 LAB — CYTOLOGY - PAP
Comment: NEGATIVE
Diagnosis: NEGATIVE
High risk HPV: NEGATIVE

## 2022-02-28 ENCOUNTER — Ambulatory Visit
Admission: RE | Admit: 2022-02-28 | Discharge: 2022-02-28 | Disposition: A | Discharge status: Home / Self Care | Payer: BC Managed Care – PPO | Source: Ambulatory Visit | Attending: Internal Medicine | Admitting: Internal Medicine

## 2022-02-28 DIAGNOSIS — Z1231 Encounter for screening mammogram for malignant neoplasm of breast: Secondary | ICD-10-CM

## 2022-04-14 ENCOUNTER — Encounter: Payer: Self-pay | Admitting: Internal Medicine

## 2022-04-28 ENCOUNTER — Other Ambulatory Visit: Payer: Self-pay | Admitting: Internal Medicine

## 2022-04-29 NOTE — Telephone Encounter (Signed)
Requested Prescriptions  Pending Prescriptions Disp Refills   amLODipine (NORVASC) 5 MG tablet [Pharmacy Med Name: AMLODIPINE BESYLATE 5 MG TAB] 90 tablet 0    Sig: TAKE 1 TABLET (5 MG TOTAL) BY MOUTH DAILY.     Cardiovascular: Calcium Channel Blockers 2 Passed - 04/28/2022  1:46 AM      Passed - Last BP in normal range    BP Readings from Last 1 Encounters:  02/17/22 114/60         Passed - Last Heart Rate in normal range    Pulse Readings from Last 1 Encounters:  02/17/22 75         Passed - Valid encounter within last 6 months    Recent Outpatient Visits           2 months ago Encounter for general adult medical examination w/o abnormal findings   [facility] [place], Coralie Keens, NP   2 months ago Acute bronchitis, unspecified organism   [facility] [place], Coralie Keens, NP   8 months ago Primary hypertension   [facility] [place], [place], NP   11 months ago Pain and swelling of left ankle   [facility] [place], Coralie Keens, NP   1 year ago Chest tightness   [facility] [place], Coralie Keens, NP       Future Appointments             In 3 months Baity, Coralie Keens, NP [facility], [facility]

## 2022-05-05 ENCOUNTER — Other Ambulatory Visit: Payer: Self-pay | Admitting: Internal Medicine

## 2022-05-06 NOTE — Telephone Encounter (Signed)
Requested Prescriptions  Pending Prescriptions Disp Refills   olmesartan (BENICAR) 20 MG tablet [Pharmacy Med Name: OLMESARTAN MEDOXOMIL 20 MG TAB] 90 tablet 1    Sig: TAKE 1 TABLET BY MOUTH EVERY DAY     Cardiovascular:  Angiotensin Receptor Blockers Passed - 05/05/2022  1:50 AM      Passed - Cr in normal range and within 180 days    Creat  Date Value Ref Range Status  02/10/2022 0.59 0.50 - 1.05 mg/dL Final         Passed - K in normal range and within 180 days    Potassium  Date Value Ref Range Status  02/10/2022 4.3 3.5 - 5.3 mmol/L Final         Passed - Patient is not pregnant      Passed - Last BP in normal range    BP Readings from Last 1 Encounters:  02/17/22 114/60         Passed - Valid encounter within last 6 months    Recent Outpatient Visits           2 months ago Encounter for general adult medical examination w/o abnormal findings   [facility] [place], Coralie Keens, NP   2 months ago Acute bronchitis, unspecified organism   [facility] [place], Coralie Keens, NP   9 months ago Primary hypertension   [facility] [place], [place], NP   11 months ago Pain and swelling of left ankle   [facility] [place], Coralie Keens, NP   1 year ago Chest tightness   [facility] [place], Coralie Keens, NP       Future Appointments             In 3 months Baity, Coralie Keens, NP [facility], [facility]

## 2022-05-19 ENCOUNTER — Other Ambulatory Visit: Payer: BC Managed Care – PPO

## 2022-06-25 ENCOUNTER — Inpatient Hospital Stay: Admission: RE | Admit: 2022-06-25 | Payer: BC Managed Care – PPO | Source: Ambulatory Visit

## 2022-07-28 ENCOUNTER — Other Ambulatory Visit: Payer: Self-pay | Admitting: Internal Medicine

## 2022-07-29 NOTE — Telephone Encounter (Signed)
Requested Prescriptions  Pending Prescriptions Disp Refills   amLODipine (NORVASC) 5 MG tablet [Pharmacy Med Name: AMLODIPINE BESYLATE 5 MG TAB] 90 tablet 0    Sig: TAKE 1 TABLET (5 MG TOTAL) BY MOUTH DAILY.     Cardiovascular: Calcium Channel Blockers 2 Passed - 07/28/2022  2:48 AM      Passed - Last BP in normal range    BP Readings from Last 1 Encounters:  02/17/22 114/60         Passed - Last Heart Rate in normal range    Pulse Readings from Last 1 Encounters:  02/17/22 75         Passed - Valid encounter within last 6 months    Recent Outpatient Visits           5 months ago Encounter for general adult medical examination w/o abnormal findings   [facility] [place], Salvadore Oxford, NP   5 months ago Acute bronchitis, unspecified organism   [facility] [place], Salvadore Oxford, NP   11 months ago Primary hypertension   [facility] [place], [place], NP   1 year ago Pain and swelling of left ankle   [facility] [place], Salvadore Oxford, NP   1 year ago Chest tightness   [facility] [place], Salvadore Oxford, NP       Future Appointments             In 3 weeks Sampson Si, Salvadore Oxford, NP [place] [facility], [facility]

## 2022-08-11 ENCOUNTER — Other Ambulatory Visit: Payer: Self-pay | Admitting: Internal Medicine

## 2022-08-12 NOTE — Telephone Encounter (Signed)
Requested Prescriptions  Pending Prescriptions Disp Refills   lovastatin (MEVACOR) 10 MG tablet [Pharmacy Med Name: LOVASTATIN 10 MG TABLET] 45 tablet 1    Sig: TAKE 1 TABLET BY MOUTH EVERY OTHER DAY     Cardiovascular:  Antilipid - Statins 2 Failed - 08/11/2022  2:34 AM      Failed - Lipid Panel in normal range within the last 12 months    Cholesterol  Date Value Ref Range Status  02/10/2022 232 (H) <200 mg/dL Final   LDL Cholesterol (Calc)  Date Value Ref Range Status  02/10/2022 141 (H) mg/dL (calc) Final    Comment:    Reference range: <100 . Desirable range <100 mg/dL for primary prevention;   <70 mg/dL for patients with CHD or diabetic patients  with > or = 2 CHD risk factors. Marland Kitchen LDL-C is now calculated using the Martin-Hopkins  calculation, which is a validated novel method providing  better accuracy than the Friedewald equation in the  estimation of LDL-C.  Horald Pollen et al. Lenox Ahr. 8295;621(30): 2061-2068  (http://education.[employer].com/faq/FAQ164)    HDL  Date Value Ref Range Status  02/10/2022 69 > OR = 50 mg/dL Final   Triglycerides  Date Value Ref Range Status  02/10/2022 102 <150 mg/dL Final         Passed - Cr in normal range and within 360 days    Creat  Date Value Ref Range Status  02/10/2022 0.59 0.50 - 1.05 mg/dL Final         Passed - Patient is not pregnant      Passed - Valid encounter within last 12 months    Recent Outpatient Visits           5 months ago Encounter for general adult medical examination w/o abnormal findings   [facility] [place], Salvadore Oxford, NP   6 months ago Acute bronchitis, unspecified organism   [facility] [place], Salvadore Oxford, NP   1 year ago Primary hypertension   [facility] [place], Salvadore Oxford, NP   1 year ago Pain and swelling of left ankle   [facility] [place], Salvadore Oxford, NP   1 year ago Chest tightness    [facility] [place], Salvadore Oxford, NP       Future Appointments             In 1 week Sampson Si, Salvadore Oxford, NP [facility], [facility]

## 2022-08-19 ENCOUNTER — Encounter: Payer: Self-pay | Admitting: Internal Medicine

## 2022-08-19 ENCOUNTER — Ambulatory Visit (INDEPENDENT_AMBULATORY_CARE_PROVIDER_SITE_OTHER): Payer: BC Managed Care – PPO | Admitting: Internal Medicine

## 2022-08-19 VITALS — BP 112/64 | HR 68 | Temp 97.2°F | Wt 107.0 lb

## 2022-08-19 DIAGNOSIS — I10 Essential (primary) hypertension: Secondary | ICD-10-CM

## 2022-08-19 DIAGNOSIS — N952 Postmenopausal atrophic vaginitis: Secondary | ICD-10-CM

## 2022-08-19 DIAGNOSIS — F32A Depression, unspecified: Secondary | ICD-10-CM

## 2022-08-19 DIAGNOSIS — R7309 Other abnormal glucose: Secondary | ICD-10-CM | POA: Diagnosis not present

## 2022-08-19 DIAGNOSIS — F419 Anxiety disorder, unspecified: Secondary | ICD-10-CM | POA: Diagnosis not present

## 2022-08-19 DIAGNOSIS — E7800 Pure hypercholesterolemia, unspecified: Secondary | ICD-10-CM

## 2022-08-19 LAB — CBC
MPV: 10.8 fL (ref 7.5–12.5)
Platelets: 142 10*3/uL (ref 140–400)
RDW: 12.2 % (ref 11.0–15.0)

## 2022-08-19 MED ORDER — LORAZEPAM 0.5 MG PO TABS: 0.5000 mg | ORAL_TABLET | Freq: Every day | ORAL | 0 refills | Status: DC | PRN

## 2022-08-19 NOTE — Assessment & Plan Note (Signed)
Not medicated Will monitor 

## 2022-08-19 NOTE — Progress Notes (Signed)
Subjective:    Patient ID: Katie Martinez, female    DOB: Jul 08, 1960, 62 y.o.   MRN: [id number]  HPI  Patient presents to clinic today for 31-month follow-up of chronic conditions.  HTN: Her BP today is 112/64.  She is taking olmesartan and amlodipine as prescribed.  ECG from 04/2021 reviewed.  Anxiety and Depression: Chronic, managed on lorazepam as needed.  She is not currently seeing a therapist.  She denies SI/HI.  HLD: Her last LDL was 141, triglycerides 102, 01/2022.  She is not taking any cholesterol-lowering medication at this time, she had myalgias with lovastatin, simvistatin.  She tries to consume low-fat diet.  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  She does not follow with GYN.  Review of Systems  Past Medical History:  Diagnosis Date   Hyperlipidemia    Hypertension     Current Outpatient Medications  Medication Sig Dispense Refill   amLODipine (NORVASC) 5 MG tablet TAKE 1 TABLET (5 MG TOTAL) BY MOUTH DAILY. 90 tablet 0   Cholecalciferol (VITAMIN D) 50 MCG (2000 UT) CAPS Take by mouth.     LORazepam (ATIVAN) 0.5 MG tablet Take 1 tablet (0.5 mg total) by mouth daily as needed for anxiety. 20 tablet 0   olmesartan (BENICAR) 20 MG tablet TAKE 1 TABLET BY MOUTH EVERY DAY 90 tablet 1   ondansetron (ZOFRAN ODT) 4 MG disintegrating tablet Take 1 tablet (4 mg total) by mouth every 8 (eight) hours as needed for nausea or vomiting. 20 tablet 0   No current facility-administered medications for this visit.    Allergies  Allergen Reactions   Lisinopril Other (See Comments)    Dry cough     Metoprolol Other (See Comments)    Vivid dreams, dizziness    Family History  Problem Relation Age of Onset   Lymphoma Mother    Hypertension Mother    Hyperlipidemia Mother    Arthritis Father    Hyperlipidemia Father    Hypertension Father    Stroke Father    Hyperlipidemia Brother    Hypertension Brother    Prostate cancer  Brother    Skin cancer Brother    Hypertension Brother    Hyperlipidemia Brother    Prostate cancer Brother    Skin cancer Brother    Arthritis Maternal Grandmother    Lymphoma Maternal Grandmother    Stroke Maternal Grandmother    Brain cancer Maternal Grandfather    Hypertension Maternal Grandfather    Hyperlipidemia Maternal Grandfather    Heart disease Maternal Grandfather    Heart disease Paternal Grandmother    Hyperlipidemia Paternal Grandmother    Hypertension Paternal Grandmother    Stroke Paternal Grandfather    Arthritis Paternal Grandfather    Breast cancer Maternal Aunt    Breast cancer Maternal Aunt     Social History   Socioeconomic History   Marital status: Married    Spouse name: Not on file   Number of children: Not on file   Years of education: Not on file   Highest education level: Associate degree: academic program  Occupational History   Not on file  Tobacco Use   Smoking status: Never   Smokeless tobacco: Never  Vaping Use   Vaping Use: Never used  Substance and Sexual Activity   Alcohol use: Yes    Comment: 2 glasses at night   Drug use: Never   Sexual activity: Not on file  Other Topics Concern   Not  on file  Social History Narrative   Not on file   Social Determinants of Health   Financial Resource Strain: Low Risk  (08/15/2022)   Overall Financial Resource Strain (CARDIA)    Difficulty of Paying Living Expenses: Not very hard  Food Insecurity: No Food Insecurity (08/15/2022)   Hunger Vital Sign    Worried About Running Out of Food in the Last Year: Never true    Ran Out of Food in the Last Year: Never true  Transportation Needs: No Transportation Needs (08/15/2022)   PRAPARE - Administrator, Civil Service (Medical): No    Lack of Transportation (Non-Medical): No  Physical Activity: Insufficiently Active (08/15/2022)   Exercise Vital Sign    Days of Exercise per Week: 3 days    Minutes of Exercise per Session: 30 min   Stress: No Stress Concern Present (08/15/2022)   [employer] of Occupational Health - Occupational Stress Questionnaire    Feeling of Stress : Only a little  Social Connections: Moderately Integrated (08/15/2022)   Social Connection and Isolation Panel [NHANES]    Frequency of Communication with Friends and Family: More than three times a week    Frequency of Social Gatherings with Friends and Family: Once a week    Attends Religious Services: 1 to 4 times per year    Active Member of [employer] or Organizations: No    Attends Engineer, structural: Not on file    Marital Status: Married  Catering manager Violence: Not on file     Constitutional: Denies fever, malaise, fatigue, headache or abrupt weight changes.  HEENT: Denies eye pain, eye redness, ear pain, ringing in the ears, wax buildup, runny nose, nasal congestion, bloody nose, or sore throat. Respiratory: Denies difficulty breathing, shortness of breath, cough or sputum production.   Cardiovascular: Denies chest pain, chest tightness, palpitations or swelling in the hands or feet.  Gastrointestinal: Denies abdominal pain, bloating, constipation, diarrhea or blood in the stool.  GU: Denies urgency, frequency, pain with urination, burning sensation, blood in urine, odor or discharge. Musculoskeletal: Denies decrease in range of motion, difficulty with gait, muscle pain or joint pain and swelling.  Skin: Denies redness, rashes, lesions or ulcercations.  Neurological: Denies dizziness, difficulty with memory, difficulty with speech or problems with balance and coordination.  Psych: Patient has a history of anxiety and depression.  Denies SI/HI.  No other specific complaints in a complete review of systems (except as listed in HPI above).     Objective:   Physical Exam   BP 112/64 (BP Location: Right Arm, Patient Position: Sitting, Cuff Size: Normal)   Pulse 68   Temp (!) 97.2 F (36.2 C) (Temporal)   Wt 107 lb (48.5  kg)   SpO2 99%   BMI 20.90 kg/m   Wt Readings from Last 3 Encounters:  02/17/22 110 lb (49.9 kg)  02/10/22 109 lb (49.4 kg)  08/06/21 105 lb (47.6 kg)    General: Appears her stated age, well developed, well nourished in NAD. Skin: Warm, dry and intact.  HEENT: Head: normal shape and size; Eyes: sclera white, no icterus, conjunctiva pink, PERRLA and EOMs intact;  Cardiovascular: Normal rate and rhythm. S1,S2 noted.  No murmur, rubs or gallops noted. No JVD or BLE edema. No carotid bruits noted. Pulmonary/Chest: Normal effort and positive vesicular breath sounds. No respiratory distress. No wheezes, rales or ronchi noted.  Musculoskeletal: No difficulty with gait.  Neurological: Alert and oriented. Coordination normal.  Psychiatric: Mood  and affect normal. Behavior is normal. Judgment and thought content normal.     BMET    Component Value Date/Time   NA 141 02/10/2022 1005   K 4.3 02/10/2022 1005   CL 105 02/10/2022 1005   CO2 28 02/10/2022 1005   GLUCOSE 83 02/10/2022 1005   BUN 10 02/10/2022 1005   CREATININE 0.59 02/10/2022 1005   CALCIUM 9.1 02/10/2022 1005   GFRNONAA >60 11/18/2020 1523    Lipid Panel     Component Value Date/Time   CHOL 232 (H) 02/10/2022 1005   TRIG 102 02/10/2022 1005   HDL 69 02/10/2022 1005   CHOLHDL 3.4 02/10/2022 1005   VLDL 30.0 02/08/2020 1541   LDLCALC 141 (H) 02/10/2022 1005    CBC    Component Value Date/Time   WBC 4.9 02/10/2022 1005   RBC 3.86 02/10/2022 1005   HGB 12.8 02/10/2022 1005   HCT 37.0 02/10/2022 1005   PLT 269 02/10/2022 1005   MCV 95.9 02/10/2022 1005   MCH 33.2 (H) 02/10/2022 1005   MCHC 34.6 02/10/2022 1005   RDW 11.3 02/10/2022 1005   LYMPHSABS 1.2 11/18/2020 1523   MONOABS 0.5 11/18/2020 1523   EOSABS 0.0 11/18/2020 1523   BASOSABS 0.0 11/18/2020 1523    Hgb A1C No results found for: "HGBA1C"         Assessment & Plan:     RTC in 6 months for your annual exam Nicki Reaper, NP

## 2022-08-19 NOTE — Assessment & Plan Note (Signed)
Controlled on amlodipine and olmesartan Reinforced DASH diet and exercise for weight loss CMET today

## 2022-08-19 NOTE — Assessment & Plan Note (Signed)
CMET and lipid profile today Encouraged her to consume a low fat diet 

## 2022-08-19 NOTE — Assessment & Plan Note (Signed)
Continue lorazepam as needed Support offered

## 2022-08-19 NOTE — Patient Instructions (Signed)

## 2022-08-20 LAB — COMPLETE METABOLIC PANEL WITH GFR
AG Ratio: 1.8 (calc) (ref 1.0–2.5)
ALT: 15 U/L (ref 6–29)
AST: 16 U/L (ref 10–35)
Albumin: 4.5 g/dL (ref 3.6–5.1)
Alkaline phosphatase (APISO): 37 U/L (ref 37–153)
BUN: 14 mg/dL (ref 7–25)
CO2: 28 mmol/L (ref 20–32)
Calcium: 9.2 mg/dL (ref 8.6–10.4)
Chloride: 107 mmol/L (ref 98–110)
Creat: 0.63 mg/dL (ref 0.50–1.05)
Globulin: 2.5 g/dL (calc) (ref 1.9–3.7)
Glucose, Bld: 85 mg/dL (ref 65–99)
Potassium: 4.6 mmol/L (ref 3.5–5.3)
Sodium: 141 mmol/L (ref 135–146)
Total Bilirubin: 0.5 mg/dL (ref 0.2–1.2)
Total Protein: 7 g/dL (ref 6.1–8.1)
eGFR: 100 mL/min/{1.73_m2} (ref >=60)

## 2022-08-20 LAB — CBC
HCT: 38.9 % (ref 35.0–45.0)
Hemoglobin: 12.9 g/dL (ref 11.7–15.5)
MCH: 32.8 pg (ref 27.0–33.0)
MCHC: 33.2 g/dL (ref 32.0–36.0)
MCV: 99 fL (ref 80.0–100.0)
RBC: 3.93 10*6/uL (ref 3.80–5.10)
WBC: 4.8 10*3/uL (ref 3.8–10.8)

## 2022-08-20 LAB — LIPID PANEL
Cholesterol: 248 mg/dL — ABNORMAL HIGH (ref <200)
HDL: 72 mg/dL (ref >=50)
LDL Cholesterol (Calc): 158 mg/dL (calc) — ABNORMAL HIGH
Non-HDL Cholesterol (Calc): 176 mg/dL (calc) — ABNORMAL HIGH (ref <130)
Total CHOL/HDL Ratio: 3.4 (calc) (ref <5.0)
Triglycerides: 80 mg/dL (ref <150)

## 2022-08-20 LAB — HEMOGLOBIN A1C
Hgb A1c MFr Bld: 5.3 % of total Hgb (ref <5.7)
Mean Plasma Glucose: 105 mg/dL
eAG (mmol/L): 5.8 mmol/L

## 2022-08-25 ENCOUNTER — Encounter: Payer: Self-pay | Admitting: Internal Medicine

## 2022-09-15 ENCOUNTER — Encounter: Payer: Self-pay | Admitting: Internal Medicine

## 2022-09-15 MED ORDER — VENLAFAXINE HCL ER 37.5 MG PO CP24: 37.5000 mg | ORAL_CAPSULE | Freq: Every day | ORAL | 0 refills | Status: DC

## 2022-10-17 ENCOUNTER — Encounter: Payer: Self-pay | Admitting: Internal Medicine

## 2022-10-17 MED ORDER — REPATHA SURECLICK 140 MG/ML ~~LOC~~ SOAJ: 140.0000 mg | SUBCUTANEOUS | 2 refills | Status: DC

## 2022-10-24 ENCOUNTER — Other Ambulatory Visit: Payer: Self-pay | Admitting: Internal Medicine

## 2022-10-24 NOTE — Telephone Encounter (Signed)
Requested Prescriptions  Pending Prescriptions Disp Refills   amLODipine (NORVASC) 5 MG tablet [Pharmacy Med Name: AMLODIPINE BESYLATE 5 MG TAB] 90 tablet 1    Sig: TAKE 1 TABLET (5 MG TOTAL) BY MOUTH DAILY.     Cardiovascular: Calcium Channel Blockers 2 Passed - 10/24/2022  1:40 AM      Passed - Last BP in normal range    BP Readings from Last 1 Encounters:  08/19/22 112/64         Passed - Last Heart Rate in normal range    Pulse Readings from Last 1 Encounters:  08/19/22 68         Passed - Valid encounter within last 6 months    Recent Outpatient Visits           2 months ago Pure hypercholesterolemia   [place] [facility] [place], Salvadore Oxford, NP   8 months ago Encounter for general adult medical examination w/o abnormal findings   [place] [facility] [place], Salvadore Oxford, NP   8 months ago Acute bronchitis, unspecified organism   [facility] [place], Salvadore Oxford, NP   1 year ago Primary hypertension   [place] [facility] [place], [place], NP   1 year ago Pain and swelling of left ankle   [place] [facility] [place], Salvadore Oxford, NP       Future Appointments             In 3 months Baity, Salvadore Oxford, NP [place] [facility], [facility]

## 2022-10-30 ENCOUNTER — Other Ambulatory Visit: Payer: Self-pay | Admitting: Internal Medicine

## 2022-10-30 NOTE — Telephone Encounter (Signed)
Requested Prescriptions  Pending Prescriptions Disp Refills   olmesartan (BENICAR) 20 MG tablet [Pharmacy Med Name: OLMESARTAN MEDOXOMIL 20 MG TAB] 90 tablet 0    Sig: TAKE 1 TABLET BY MOUTH EVERY DAY     Cardiovascular:  Angiotensin Receptor Blockers Passed - 10/30/2022  1:41 AM      Passed - Cr in normal range and within 180 days    Creat  Date Value Ref Range Status  08/19/2022 0.63 0.50 - 1.05 mg/dL Final         Passed - K in normal range and within 180 days    Potassium  Date Value Ref Range Status  08/19/2022 4.6 3.5 - 5.3 mmol/L Final         Passed - Patient is not pregnant      Passed - Last BP in normal range    BP Readings from Last 1 Encounters:  08/19/22 112/64         Passed - Valid encounter within last 6 months    Recent Outpatient Visits           2 months ago Pure hypercholesterolemia   [facility] [place], Salvadore Oxford, NP   8 months ago Encounter for general adult medical examination w/o abnormal findings   [place] [facility] [place], Salvadore Oxford, NP   8 months ago Acute bronchitis, unspecified organism   [place] [facility] [place], Salvadore Oxford, NP   1 year ago Primary hypertension   [place] [facility] [place], [place], NP   1 year ago Pain and swelling of left ankle   [place] [facility] [place], Salvadore Oxford, NP       Future Appointments             In 3 months Baity, Salvadore Oxford, NP [place] [facility] [place], [facility]

## 2022-11-11 ENCOUNTER — Encounter: Payer: Self-pay | Admitting: Internal Medicine

## 2022-11-11 MED ORDER — ONDANSETRON 4 MG PO TBDP: 4.0000 mg | ORAL_TABLET | Freq: Three times a day (TID) | ORAL | 0 refills | Status: DC | PRN

## 2022-11-18 ENCOUNTER — Other Ambulatory Visit: Payer: Self-pay | Admitting: Internal Medicine

## 2022-11-18 MED ORDER — VENLAFAXINE HCL ER 37.5 MG PO CP24: 37.5000 mg | ORAL_CAPSULE | Freq: Every day | ORAL | 1 refills | Status: DC

## 2022-11-18 NOTE — Telephone Encounter (Signed)
I have sent this in for you.  Let me know if you have trouble getting it filled.   Thank you,   -Vernona Rieger

## 2022-11-21 ENCOUNTER — Encounter: Payer: Self-pay | Admitting: Internal Medicine

## 2022-11-25 ENCOUNTER — Encounter: Payer: Self-pay | Admitting: Internal Medicine

## 2022-11-25 ENCOUNTER — Ambulatory Visit (INDEPENDENT_AMBULATORY_CARE_PROVIDER_SITE_OTHER): Payer: BC Managed Care – PPO | Admitting: Internal Medicine

## 2022-11-25 VITALS — BP 138/79 | HR 79 | Temp 96.8°F | Wt 104.0 lb

## 2022-11-25 DIAGNOSIS — E7800 Pure hypercholesterolemia, unspecified: Secondary | ICD-10-CM | POA: Diagnosis not present

## 2022-11-25 DIAGNOSIS — I10 Essential (primary) hypertension: Secondary | ICD-10-CM | POA: Diagnosis not present

## 2022-11-25 DIAGNOSIS — F32A Depression, unspecified: Secondary | ICD-10-CM

## 2022-11-25 DIAGNOSIS — F419 Anxiety disorder, unspecified: Secondary | ICD-10-CM

## 2022-11-25 DIAGNOSIS — Z23 Encounter for immunization: Secondary | ICD-10-CM | POA: Diagnosis not present

## 2022-11-25 NOTE — Assessment & Plan Note (Signed)
Elevated today She will continue to monitor for now If >1 30/80 consistently, she will plan to stop amlodipine and increase olmesartan to 40 mg daily Reinforced DASH diet and exercise

## 2022-11-25 NOTE — Patient Instructions (Signed)

## 2022-11-25 NOTE — Progress Notes (Signed)
Subjective:    Patient ID: Katie Martinez, female    DOB: 09-01-1960, 62 y.o.   MRN: [id number]  HPI  Patient presents to the clinic today to discuss anxiety and depression.  She was taking venlafaxine which has seemed to help her mood some but she reports some nausea, fatigue and constipation, so she stopped this. She feels like she had similar symptoms with citalopram which she took a few years ago. She is taking lorazepam as needed. She is not currently seeing a therapist.  She denies SI/HI.  Of note, her BP today is 148/80. She is taking amlodipine and olmesartan as prescribed. She reports she has been monitoring her BP at home, it has been running 140/85.  She has noticed some swelling intermittently in her lower extremities.  ECG from 04/2021 reviewed.  She also reports she stopped taking repatha. She reports it caused nausea and joint pain. Her last LDL was 158, triglycerides 80, 08/2022. She has been on lovastatin, atorvastatin, rosuvastatin, simvastatin and she reports they all caused elevated liver enzymes and myalgias.  Review of Systems     Past Medical History:  Diagnosis Date   Hyperlipidemia    Hypertension     Current Outpatient Medications  Medication Sig Dispense Refill   amLODipine (NORVASC) 5 MG tablet TAKE 1 TABLET (5 MG TOTAL) BY MOUTH DAILY. 90 tablet 1   Cholecalciferol (VITAMIN D) 50 MCG (2000 UT) CAPS Take by mouth.     Evolocumab (REPATHA SURECLICK) 140 MG/ML SOAJ Inject 140 mg into the skin every 14 (fourteen) days. 2 mL 2   LORazepam (ATIVAN) 0.5 MG tablet Take 1 tablet (0.5 mg total) by mouth daily as needed for anxiety. 20 tablet 0   olmesartan (BENICAR) 20 MG tablet TAKE 1 TABLET BY MOUTH EVERY DAY 90 tablet 0   ondansetron (ZOFRAN ODT) 4 MG disintegrating tablet Take 1 tablet (4 mg total) by mouth every 8 (eight) hours as needed for nausea or vomiting. 20 tablet 0   venlafaxine XR (EFFEXOR XR) 37.5 MG 24 hr capsule Take 1 capsule (37.5 mg total) by  mouth daily with breakfast. 90 capsule 1   No current facility-administered medications for this visit.    Allergies  Allergen Reactions   Lisinopril Other (See Comments)    Dry cough     Metoprolol Other (See Comments)    Vivid dreams, dizziness    Family History  Problem Relation Age of Onset   Lymphoma Mother    Hypertension Mother    Hyperlipidemia Mother    Arthritis Father    Hyperlipidemia Father    Hypertension Father    Stroke Father    Hyperlipidemia Brother    Hypertension Brother    Prostate cancer Brother    Skin cancer Brother    Hypertension Brother    Hyperlipidemia Brother    Prostate cancer Brother    Skin cancer Brother    Arthritis Maternal Grandmother    Lymphoma Maternal Grandmother    Stroke Maternal Grandmother    Brain cancer Maternal Grandfather    Hypertension Maternal Grandfather    Hyperlipidemia Maternal Grandfather    Heart disease Maternal Grandfather    Heart disease Paternal Grandmother    Hyperlipidemia Paternal Grandmother    Hypertension Paternal Grandmother    Stroke Paternal Grandfather    Arthritis Paternal Grandfather    Breast cancer Maternal Aunt    Breast cancer Maternal Aunt     Social History   Socioeconomic History   Marital  status: Married    Spouse name: Not on file   Number of children: Not on file   Years of education: Not on file   Highest education level: Associate degree: academic program  Occupational History   Not on file  Tobacco Use   Smoking status: Never   Smokeless tobacco: Never  Vaping Use   Vaping status: Never Used  Substance and Sexual Activity   Alcohol use: Yes    Comment: 2 glasses at night   Drug use: Never   Sexual activity: Not on file  Other Topics Concern   Not on file  Social History Narrative   Not on file   Social Determinants of Health   Financial Resource Strain: Low Risk  (08/15/2022)   Overall Financial Resource Strain (CARDIA)    Difficulty of Paying Living  Expenses: Not very hard  Food Insecurity: No Food Insecurity (08/15/2022)   Hunger Vital Sign    Worried About Running Out of Food in the Last Year: Never true    Ran Out of Food in the Last Year: Never true  Transportation Needs: No Transportation Needs (08/15/2022)   PRAPARE - Administrator, Civil Service (Medical): No    Lack of Transportation (Non-Medical): No  Physical Activity: Insufficiently Active (08/15/2022)   Exercise Vital Sign    Days of Exercise per Week: 3 days    Minutes of Exercise per Session: 30 min  Stress: No Stress Concern Present (08/15/2022)   [employer] of Occupational Health - Occupational Stress Questionnaire    Feeling of Stress : Only a little  Social Connections: Moderately Integrated (08/15/2022)   Social Connection and Isolation Panel [NHANES]    Frequency of Communication with Friends and Family: More than three times a week    Frequency of Social Gatherings with Friends and Family: Once a week    Attends Religious Services: 1 to 4 times per year    Active Member of [employer] or Organizations: No    Attends Engineer, structural: Not on file    Marital Status: Married  Catering manager Violence: Not on file     Constitutional: Denies fever, malaise, fatigue, headache or abrupt weight changes.  HEENT: Denies eye pain, eye redness, ear pain, ringing in the ears, wax buildup, runny nose, nasal congestion, bloody nose, or sore throat. Respiratory: Denies difficulty breathing, shortness of breath, cough or sputum production.   Cardiovascular: Patient reports intermittent swelling in legs.  Denies chest pain, chest tightness, palpitations or swelling in the hands.  Gastrointestinal: Denies abdominal pain, bloating, constipation, diarrhea or blood in the stool.  GU: Denies urgency, frequency, pain with urination, burning sensation, blood in urine, odor or discharge. Musculoskeletal: Denies decrease in range of motion, difficulty with gait,  muscle pain or joint pain and swelling.  Skin: Denies redness, rashes, lesions or ulcercations.  Neurological: Denies dizziness, difficulty with memory, difficulty with speech or problems with balance and coordination.  Psych: Patient has a history of anxiety and depression.  Denies SI/HI.  No other specific complaints in a complete review of systems (except as listed in HPI above).  Objective:   Physical Exam  BP 138/79   Pulse 79   Temp (!) 96.8 F (36 C) (Temporal)   Wt 104 lb (47.2 kg)   SpO2 99%   BMI 20.31 kg/m   Wt Readings from Last 3 Encounters:  08/19/22 107 lb (48.5 kg)  02/17/22 110 lb (49.9 kg)  02/10/22 109 lb (49.4 kg)  General: Appears her stated age, well developed, well nourished in NAD. Skin: Warm, dry and intact.  Cardiovascular: Normal rate and rhythm. S1,S2 noted.  No murmur, rubs or gallops noted. No JVD or BLE edema. No carotid bruits noted. Pulmonary/Chest: Normal effort and positive vesicular breath sounds. No respiratory distress. No wheezes, rales or ronchi noted.  Musculoskeletal: No difficulty with gait.  Neurological: Alert and oriented. Coordination normal.  Psychiatric: Mood and affect normal. Behavior is normal. Judgment and thought content normal.    BMET    Component Value Date/Time   NA 141 08/19/2022 0831   K 4.6 08/19/2022 0831   CL 107 08/19/2022 0831   CO2 28 08/19/2022 0831   GLUCOSE 85 08/19/2022 0831   BUN 14 08/19/2022 0831   CREATININE 0.63 08/19/2022 0831   CALCIUM 9.2 08/19/2022 0831   GFRNONAA >60 11/18/2020 1523    Lipid Panel     Component Value Date/Time   CHOL 248 (H) 08/19/2022 0831   TRIG 80 08/19/2022 0831   HDL 72 08/19/2022 0831   CHOLHDL 3.4 08/19/2022 0831   VLDL 30.0 02/08/2020 1541   LDLCALC 158 (H) 08/19/2022 0831    CBC    Component Value Date/Time   WBC 4.8 08/19/2022 0831   RBC 3.93 08/19/2022 0831   HGB 12.9 08/19/2022 0831   HCT 38.9 08/19/2022 0831   PLT 142 08/19/2022 0831   MCV  99.0 08/19/2022 0831   MCH 32.8 08/19/2022 0831   MCHC 33.2 08/19/2022 0831   RDW 12.2 08/19/2022 0831   LYMPHSABS 1.2 11/18/2020 1523   MONOABS 0.5 11/18/2020 1523   EOSABS 0.0 11/18/2020 1523   BASOSABS 0.0 11/18/2020 1523    Hgb A1C Lab Results  Component Value Date   HGBA1C 5.3 08/19/2022           Assessment & Plan:     RTC in 3 months, annual exam Nicki Reaper, NP

## 2022-11-25 NOTE — Assessment & Plan Note (Signed)
She would like to continue lorazepam as needed instead of taking daily medication at this time Support offered

## 2022-11-25 NOTE — Assessment & Plan Note (Signed)
She has been on unable to tolerate statins or Repatha Encourage low-fat diet

## 2022-12-31 ENCOUNTER — Ambulatory Visit
Admission: RE | Admit: 2022-12-31 | Discharge: 2022-12-31 | Disposition: A | Discharge status: Home / Self Care | Payer: BC Managed Care – PPO | Source: Ambulatory Visit | Attending: Internal Medicine | Admitting: Internal Medicine

## 2022-12-31 DIAGNOSIS — Z780 Asymptomatic menopausal state: Secondary | ICD-10-CM

## 2022-12-31 DIAGNOSIS — M8588 Other specified disorders of bone density and structure, other site: Secondary | ICD-10-CM | POA: Diagnosis not present

## 2022-12-31 DIAGNOSIS — N958 Other specified menopausal and perimenopausal disorders: Secondary | ICD-10-CM | POA: Diagnosis not present

## 2023-01-01 ENCOUNTER — Encounter: Payer: Self-pay | Admitting: Internal Medicine

## 2023-01-30 ENCOUNTER — Other Ambulatory Visit: Payer: Self-pay | Admitting: Internal Medicine

## 2023-02-02 ENCOUNTER — Other Ambulatory Visit: Payer: Self-pay | Admitting: Internal Medicine

## 2023-02-02 DIAGNOSIS — Z1231 Encounter for screening mammogram for malignant neoplasm of breast: Secondary | ICD-10-CM

## 2023-02-02 NOTE — Telephone Encounter (Signed)
Requested Prescriptions  Pending Prescriptions Disp Refills   olmesartan (BENICAR) 20 MG tablet [Pharmacy Med Name: OLMESARTAN MEDOXOMIL 20 MG TAB] 90 tablet 0    Sig: TAKE 1 TABLET BY MOUTH EVERY DAY     Cardiovascular:  Angiotensin Receptor Blockers Passed - 01/30/2023  1:48 AM      Passed - Cr in normal range and within 180 days    Creat  Date Value Ref Range Status  08/19/2022 0.63 0.50 - 1.05 mg/dL Final         Passed - K in normal range and within 180 days    Potassium  Date Value Ref Range Status  08/19/2022 4.6 3.5 - 5.3 mmol/L Final         Passed - Patient is not pregnant      Passed - Last BP in normal range    BP Readings from Last 1 Encounters:  11/25/22 138/79         Passed - Valid encounter within last 6 months    Recent Outpatient Visits           2 months ago Need for influenza vaccination   [place] [facility] [place], Salvadore Oxford, NP   5 months ago Pure hypercholesterolemia   [name], Salvadore Oxford, NP   11 months ago Encounter for general adult medical examination w/o abnormal findings   [name], Salvadore Oxford, NP   11 months ago Acute bronchitis, unspecified organism   [name], Salvadore Oxford, NP   1 year ago Primary hypertension   [name], Salvadore Oxford, NP       Future Appointments             In 2 weeks [place] Si, Salvadore Oxford, NP [place] [facility], [facility]

## 2023-02-19 ENCOUNTER — Encounter: Payer: BC Managed Care – PPO | Admitting: Internal Medicine

## 2023-02-20 ENCOUNTER — Encounter: Payer: BC Managed Care – PPO | Admitting: Internal Medicine

## 2023-02-20 ENCOUNTER — Ambulatory Visit (INDEPENDENT_AMBULATORY_CARE_PROVIDER_SITE_OTHER): Payer: BC Managed Care – PPO | Admitting: Internal Medicine

## 2023-02-20 ENCOUNTER — Encounter: Payer: Self-pay | Admitting: Internal Medicine

## 2023-02-20 VITALS — BP 128/80 | HR 74 | Ht 60.0 in | Wt 105.0 lb

## 2023-02-20 DIAGNOSIS — Z23 Encounter for immunization: Secondary | ICD-10-CM

## 2023-02-20 DIAGNOSIS — R739 Hyperglycemia, unspecified: Secondary | ICD-10-CM

## 2023-02-20 DIAGNOSIS — E7800 Pure hypercholesterolemia, unspecified: Secondary | ICD-10-CM | POA: Diagnosis not present

## 2023-02-20 DIAGNOSIS — Z0001 Encounter for general adult medical examination with abnormal findings: Secondary | ICD-10-CM | POA: Diagnosis not present

## 2023-02-20 MED ORDER — ONDANSETRON 4 MG PO TBDP: 4.0000 mg | ORAL_TABLET | Freq: Three times a day (TID) | ORAL | 0 refills | Status: DC | PRN

## 2023-02-20 NOTE — Patient Instructions (Signed)
Health Maintenance for Postmenopausal Women Menopause is a normal process in which your ability to get pregnant comes to an end. This process happens slowly over many months or years, usually between the ages of 48 and 55. Menopause is complete when you have missed your menstrual period for 12 months. It is important to talk with your health care provider about some of the most common conditions that affect women after menopause (postmenopausal women). These include heart disease, cancer, and bone loss (osteoporosis). Adopting a healthy lifestyle and getting preventive care can help to promote your health and wellness. The actions you take can also lower your chances of developing some of these common conditions. What are the signs and symptoms of menopause? During menopause, you may have the following symptoms: Hot flashes. These can be moderate or severe. Night sweats. Decrease in sex drive. Mood swings. Headaches. Tiredness (fatigue). Irritability. Memory problems. Problems falling asleep or staying asleep. Talk with your health care provider about treatment options for your symptoms. Do I need hormone replacement therapy? Hormone replacement therapy is effective in treating symptoms that are caused by menopause, such as hot flashes and night sweats. Hormone replacement carries certain risks, especially as you become older. If you are thinking about using estrogen or estrogen with progestin, discuss the benefits and risks with your health care provider. How can I reduce my risk for heart disease and stroke? The risk of heart disease, heart attack, and stroke increases as you age. One of the causes may be a change in the body's hormones during menopause. This can affect how your body uses dietary fats, triglycerides, and cholesterol. Heart attack and stroke are medical emergencies. There are many things that you can do to help prevent heart disease and stroke. Watch your blood pressure High  blood pressure causes heart disease and increases the risk of stroke. This is more likely to develop in people who have high blood pressure readings or are overweight. Have your blood pressure checked: Every 3-5 years if you are 18-39 years of age. Every year if you are 40 years old or older. Eat a healthy diet  Eat a diet that includes plenty of vegetables, fruits, low-fat dairy products, and lean protein. Do not eat a lot of foods that are high in solid fats, added sugars, or sodium. Get regular exercise Get regular exercise. This is one of the most important things you can do for your health. Most adults should: Try to exercise for at least 150 minutes each week. The exercise should increase your heart rate and make you sweat (moderate-intensity exercise). Try to do strengthening exercises at least twice each week. Do these in addition to the moderate-intensity exercise. Spend less time sitting. Even light physical activity can be beneficial. Other tips Work with your health care provider to achieve or maintain a healthy weight. Do not use any products that contain nicotine or tobacco. These products include cigarettes, chewing tobacco, and vaping devices, such as e-cigarettes. If you need help quitting, ask your health care provider. Know your numbers. Ask your health care provider to check your cholesterol and your blood sugar (glucose). Continue to have your blood tested as directed by your health care provider. Do I need screening for cancer? Depending on your health history and family history, you may need to have cancer screenings at different stages of your life. This may include screening for: Breast cancer. Cervical cancer. Lung cancer. Colorectal cancer. What is my risk for osteoporosis? After menopause, you may be   at increased risk for osteoporosis. Osteoporosis is a condition in which bone destruction happens more quickly than new bone creation. To help prevent osteoporosis or  the bone fractures that can happen because of osteoporosis, you may take the following actions: If you are 19-50 years old, get at least 1,000 mg of calcium and at least 600 international units (IU) of vitamin D per day. If you are older than age 50 but younger than age 70, get at least 1,200 mg of calcium and at least 600 international units (IU) of vitamin D per day. If you are older than age 70, get at least 1,200 mg of calcium and at least 800 international units (IU) of vitamin D per day. Smoking and drinking excessive alcohol increase the risk of osteoporosis. Eat foods that are rich in calcium and vitamin D, and do weight-bearing exercises several times each week as directed by your health care provider. How does menopause affect my mental health? Depression may occur at any age, but it is more common as you become older. Common symptoms of depression include: Feeling depressed. Changes in sleep patterns. Changes in appetite or eating patterns. Feeling an overall lack of motivation or enjoyment of activities that you previously enjoyed. Frequent crying spells. Talk with your health care provider if you think that you are experiencing any of these symptoms. General instructions See your health care provider for regular wellness exams and vaccines. This may include: Scheduling regular health, dental, and eye exams. Getting and maintaining your vaccines. These include: Influenza vaccine. Get this vaccine each year before the flu season begins. Pneumonia vaccine. Shingles vaccine. Tetanus, diphtheria, and pertussis (Tdap) booster vaccine. Your health care provider may also recommend other immunizations. Tell your health care provider if you have ever been abused or do not feel safe at home. Summary Menopause is a normal process in which your ability to get pregnant comes to an end. This condition causes hot flashes, night sweats, decreased interest in sex, mood swings, headaches, or lack  of sleep. Treatment for this condition may include hormone replacement therapy. Take actions to keep yourself healthy, including exercising regularly, eating a healthy diet, watching your weight, and checking your blood pressure and blood sugar levels. Get screened for cancer and depression. Make sure that you are up to date with all your vaccines. This information is not intended to replace advice given to you by your health care provider. Make sure you discuss any questions you have with your health care provider. Document Revised: 07/09/2020 Document Reviewed: 07/09/2020 Elsevier Patient Education  2024 Elsevier Inc.  

## 2023-02-20 NOTE — Addendum Note (Signed)
Addended by: Judd Gaudier on: 02/20/2023 09:43 AM   Modules accepted: Orders

## 2023-02-20 NOTE — Progress Notes (Signed)
Subjective:    Patient ID: Katie Martinez, female    DOB: 08/12/1960, 62 y.o.   MRN: [id number]  HPI  Patient presents to clinic today for her annual exam.  Flu: 11/2022 Tetanus: 05/2011 but she thinks it was more like 6 years ago COVID: Pfizer x 2 Shingrix: 09/2018, 12/2018 Pap smear: 01/2022 Mammogram: 03/2022, scheduled Bone density: 12/2022 Colon screening: 01/2021 Vision screening: annually Dentist: biannually  Diet: She does eat meat. She consumes fruits and veggies. She tries to avoid fried foods. She drinks mostly water, sweet tea. Exercise: None  Review of Systems     Past Medical History:  Diagnosis Date   Hyperlipidemia    Hypertension     Current Outpatient Medications  Medication Sig Dispense Refill   amLODipine (NORVASC) 5 MG tablet TAKE 1 TABLET (5 MG TOTAL) BY MOUTH DAILY. 90 tablet 1   Cholecalciferol (VITAMIN D) 50 MCG (2000 UT) CAPS Take by mouth.     LORazepam (ATIVAN) 0.5 MG tablet Take 1 tablet (0.5 mg total) by mouth daily as needed for anxiety. 20 tablet 0   olmesartan (BENICAR) 20 MG tablet TAKE 1 TABLET BY MOUTH EVERY DAY 90 tablet 0   ondansetron (ZOFRAN ODT) 4 MG disintegrating tablet Take 1 tablet (4 mg total) by mouth every 8 (eight) hours as needed for nausea or vomiting. 20 tablet 0   No current facility-administered medications for this visit.    Allergies  Allergen Reactions   Lisinopril Other (See Comments)    Dry cough     Metoprolol Other (See Comments)    Vivid dreams, dizziness    Family History  Problem Relation Age of Onset   Lymphoma Mother    Hypertension Mother    Hyperlipidemia Mother    Arthritis Father    Hyperlipidemia Father    Hypertension Father    Stroke Father    Hyperlipidemia Brother    Hypertension Brother    Prostate cancer Brother    Skin cancer Brother    Hypertension Brother    Hyperlipidemia Brother    Prostate cancer Brother    Skin cancer Brother    Arthritis Maternal Grandmother     Lymphoma Maternal Grandmother    Stroke Maternal Grandmother    Brain cancer Maternal Grandfather    Hypertension Maternal Grandfather    Hyperlipidemia Maternal Grandfather    Heart disease Maternal Grandfather    Heart disease Paternal Grandmother    Hyperlipidemia Paternal Grandmother    Hypertension Paternal Grandmother    Stroke Paternal Grandfather    Arthritis Paternal Grandfather    Breast cancer Maternal Aunt    Breast cancer Maternal Aunt     Social History   Socioeconomic History   Marital status: Married    Spouse name: Not on file   Number of children: Not on file   Years of education: Not on file   Highest education level: Associate degree: academic program  Occupational History   Not on file  Tobacco Use   Smoking status: Never   Smokeless tobacco: Never  Vaping Use   Vaping status: Never Used  Substance and Sexual Activity   Alcohol use: Yes    Comment: 2 glasses at night   Drug use: Never   Sexual activity: Not on file  Other Topics Concern   Not on file  Social History Narrative   Not on file   Social Drivers of Health   Financial Resource Strain: Low Risk  (02/18/2023)   Overall Financial  Resource Strain (CARDIA)    Difficulty of Paying Living Expenses: Not hard at all  Food Insecurity: No Food Insecurity (02/18/2023)   Hunger Vital Sign    Worried About Running Out of Food in the Last Year: Never true    Ran Out of Food in the Last Year: Never true  Transportation Needs: No Transportation Needs (02/18/2023)   PRAPARE - Administrator, Civil Service (Medical): No    Lack of Transportation (Non-Medical): No  Physical Activity: Insufficiently Active (02/18/2023)   Exercise Vital Sign    Days of Exercise per Week: 3 days    Minutes of Exercise per Session: 20 min  Stress: No Stress Concern Present (02/18/2023)   [employer] of Occupational Health - Occupational Stress Questionnaire    Feeling of Stress : Not at all   Social Connections: Socially Integrated (02/18/2023)   Social Connection and Isolation Panel [NHANES]    Frequency of Communication with Friends and Family: More than three times a week    Frequency of Social Gatherings with Friends and Family: Once a week    Attends Religious Services: 1 to 4 times per year    Active Member of [employer] or Organizations: Yes    Attends Engineer, structural: More than 4 times per year    Marital Status: Married  Catering manager Violence: Not on file     Constitutional: Denies fever, malaise, fatigue, headache or abrupt weight changes.  HEENT: Denies eye pain, eye redness, ear pain, ringing in the ears, wax buildup, runny nose, nasal congestion, bloody nose, or sore throat. Respiratory: Denies difficulty breathing, shortness of breath, cough or sputum production.   Cardiovascular: Denies chest pain, chest tightness, palpitations or swelling in the hands or feet.  Gastrointestinal:  Pt reports reflux. Denies abdominal pain, bloating, constipation, diarrhea or blood in the stool.  GU: Denies urgency, frequency, pain with urination, burning sensation, blood in urine, odor or discharge. Musculoskeletal: Denies decrease in range of motion, difficulty with gait, muscle pain or joint pain and swelling.  Skin: Denies redness, rashes, lesions or ulcercations.  Neurological: Pt reports insomnia. Denies dizziness, difficulty with memory, difficulty with speech or problems with balance and coordination.  Psych: Patient has a history of anxiety and depression.  Denies SI/HI.  No other specific complaints in a complete review of systems (except as listed in HPI above).  Objective:   Physical Exam  BP 128/80   Pulse 74   Ht 5' (1.524 m)   Wt 105 lb (47.6 kg)   SpO2 99%   BMI 20.51 kg/m    Wt Readings from Last 3 Encounters:  11/25/22 104 lb (47.2 kg)  08/19/22 107 lb (48.5 kg)  02/17/22 110 lb (49.9 kg)    General: Appears her stated age, well  developed, well nourished in NAD. Skin: Warm, dry and intact. No rashes noted. HEENT: Head: normal shape and size; Eyes: sclera white, no icterus, conjunctiva pink, PERRLA and EOMs intact;  Neck:  Neck supple, trachea midline. No masses, lumps or thyromegaly present.  Cardiovascular: Normal rate and rhythm. S1,S2 noted.  No murmur, rubs or gallops noted. No JVD or BLE edema. No carotid bruits noted. Pulmonary/Chest: Normal effort and positive vesicular breath sounds. No respiratory distress. No wheezes, rales or ronchi noted.  Abdomen: Normal bowel sounds.  Musculoskeletal: Mild kyphosis noted.  Strength 5/5 BUE/BLE.  No difficulty with gait.  Neurological: Alert and oriented. Cranial nerves II-XII grossly intact. Coordination normal.  Psychiatric: Mood and affect  normal. Behavior is normal. Judgment and thought content normal.    BMET    Component Value Date/Time   NA 141 08/19/2022 0831   K 4.6 08/19/2022 0831   CL 107 08/19/2022 0831   CO2 28 08/19/2022 0831   GLUCOSE 85 08/19/2022 0831   BUN 14 08/19/2022 0831   CREATININE 0.63 08/19/2022 0831   CALCIUM 9.2 08/19/2022 0831   GFRNONAA >60 11/18/2020 1523    Lipid Panel     Component Value Date/Time   CHOL 248 (H) 08/19/2022 0831   TRIG 80 08/19/2022 0831   HDL 72 08/19/2022 0831   CHOLHDL 3.4 08/19/2022 0831   VLDL 30.0 02/08/2020 1541   LDLCALC 158 (H) 08/19/2022 0831    CBC    Component Value Date/Time   WBC 4.8 08/19/2022 0831   RBC 3.93 08/19/2022 0831   HGB 12.9 08/19/2022 0831   HCT 38.9 08/19/2022 0831   PLT 142 08/19/2022 0831   MCV 99.0 08/19/2022 0831   MCH 32.8 08/19/2022 0831   MCHC 33.2 08/19/2022 0831   RDW 12.2 08/19/2022 0831   LYMPHSABS 1.2 11/18/2020 1523   MONOABS 0.5 11/18/2020 1523   EOSABS 0.0 11/18/2020 1523   BASOSABS 0.0 11/18/2020 1523    Hgb A1C Lab Results  Component Value Date   HGBA1C 5.3 08/19/2022           Assessment & Plan:   Preventative Health  Maintenance:  Flu shot UTD Tdap today Encouraged her to get her COVID booster Shingrix UTD Pap smear UTD Mammogram scheduled Bone density UTD Colon screening UTD Encouraged her to consume a balanced diet and exercise regimen Advised her to see an eye doctor and dentist annually Will check CBC, c-Met, lipid, A1c today  RTC in 6 months, follow-up chronic conditions Nicki Reaper, NP

## 2023-02-21 LAB — COMPLETE METABOLIC PANEL WITH GFR
AG Ratio: 1.8 (calc) (ref 1.0–2.5)
ALT: 38 U/L — ABNORMAL HIGH (ref 6–29)
AST: 27 U/L (ref 10–35)
Albumin: 4.6 g/dL (ref 3.6–5.1)
Alkaline phosphatase (APISO): 41 U/L (ref 37–153)
BUN: 15 mg/dL (ref 7–25)
CO2: 28 mmol/L (ref 20–32)
Calcium: 9.5 mg/dL (ref 8.6–10.4)
Chloride: 104 mmol/L (ref 98–110)
Creat: 0.68 mg/dL (ref 0.50–1.05)
Globulin: 2.5 g/dL (ref 1.9–3.7)
Glucose, Bld: 87 mg/dL (ref 65–99)
Potassium: 4.1 mmol/L (ref 3.5–5.3)
Sodium: 140 mmol/L (ref 135–146)
Total Bilirubin: 0.7 mg/dL (ref 0.2–1.2)
Total Protein: 7.1 g/dL (ref 6.1–8.1)
eGFR: 98 mL/min/{1.73_m2} (ref >=60)

## 2023-02-21 LAB — HEMOGLOBIN A1C
Hgb A1c MFr Bld: 5.1 %{Hb} (ref <5.7)
Mean Plasma Glucose: 100 mg/dL
eAG (mmol/L): 5.5 mmol/L

## 2023-02-21 LAB — LIPID PANEL
Cholesterol: 249 mg/dL — ABNORMAL HIGH (ref <200)
HDL: 68 mg/dL (ref >=50)
LDL Cholesterol (Calc): 158 mg/dL — ABNORMAL HIGH
Non-HDL Cholesterol (Calc): 181 mg/dL — ABNORMAL HIGH (ref <130)
Total CHOL/HDL Ratio: 3.7 (calc) (ref <5.0)
Triglycerides: 111 mg/dL (ref <150)

## 2023-02-21 LAB — CBC
HCT: 39.3 % (ref 35.0–45.0)
Hemoglobin: 13.3 g/dL (ref 11.7–15.5)
MCH: 33 pg (ref 27.0–33.0)
MCHC: 33.8 g/dL (ref 32.0–36.0)
MCV: 97.5 fL (ref 80.0–100.0)
MPV: 10.1 fL (ref 7.5–12.5)
Platelets: 245 10*3/uL (ref 140–400)
RBC: 4.03 10*6/uL (ref 3.80–5.10)
RDW: 11.6 % (ref 11.0–15.0)
WBC: 4.8 10*3/uL (ref 3.8–10.8)

## 2023-02-23 ENCOUNTER — Encounter: Payer: Self-pay | Admitting: Internal Medicine

## 2023-02-23 MED ORDER — ROSUVASTATIN CALCIUM 10 MG PO TABS: 10.0000 mg | ORAL_TABLET | ORAL | 0 refills | Status: DC

## 2023-03-02 ENCOUNTER — Ambulatory Visit
Admission: RE | Admit: 2023-03-02 | Discharge: 2023-03-02 | Disposition: A | Discharge status: Home / Self Care | Payer: BC Managed Care – PPO | Source: Ambulatory Visit

## 2023-03-02 DIAGNOSIS — Z1231 Encounter for screening mammogram for malignant neoplasm of breast: Secondary | ICD-10-CM | POA: Diagnosis not present

## 2023-04-22 ENCOUNTER — Other Ambulatory Visit: Payer: Self-pay | Admitting: Internal Medicine

## 2023-04-22 NOTE — Telephone Encounter (Signed)
Requested Prescriptions  Pending Prescriptions Disp Refills   amLODipine (NORVASC) 5 MG tablet [Pharmacy Med Name: AMLODIPINE BESYLATE 5 MG TAB] 90 tablet 1    Sig: TAKE 1 TABLET (5 MG TOTAL) BY MOUTH DAILY.     Cardiovascular: Calcium Channel Blockers 2 Passed - 04/22/2023  2:58 PM      Passed - Last BP in normal range    BP Readings from Last 1 Encounters:  02/20/23 128/80         Passed - Last Heart Rate in normal range    Pulse Readings from Last 1 Encounters:  02/20/23 74         Passed - Valid encounter within last 6 months    Recent Outpatient Visits           2 months ago Encounter for general adult medical examination with abnormal findings   [place] [facility] [place], Salvadore Oxford, NP   4 months ago Need for influenza vaccination   [place] [facility] [place], Salvadore Oxford, NP   8 months ago Pure hypercholesterolemia   [place] [facility] [place], Salvadore Oxford, NP   1 year ago Encounter for general adult medical examination w/o abnormal findings   [place] [facility] [place], Salvadore Oxford, NP   1 year ago Acute bronchitis, unspecified organism   [facility] [place], Salvadore Oxford, NP       Future Appointments             In 4 months [place], Salvadore Oxford, NP [place] [facility], [place] [facility]

## 2023-04-23 ENCOUNTER — Encounter: Payer: Self-pay | Admitting: Internal Medicine

## 2023-04-23 ENCOUNTER — Other Ambulatory Visit: Payer: Self-pay

## 2023-04-23 MED ORDER — ROSUVASTATIN CALCIUM 10 MG PO TABS: 10.0000 mg | ORAL_TABLET | Freq: Every day | ORAL | 1 refills | Status: DC

## 2023-04-30 ENCOUNTER — Encounter: Payer: Self-pay | Admitting: Internal Medicine

## 2023-04-30 ENCOUNTER — Telehealth (INDEPENDENT_AMBULATORY_CARE_PROVIDER_SITE_OTHER): Payer: BC Managed Care – PPO | Admitting: Internal Medicine

## 2023-04-30 DIAGNOSIS — Z20828 Contact with and (suspected) exposure to other viral communicable diseases: Secondary | ICD-10-CM | POA: Diagnosis not present

## 2023-04-30 MED ORDER — OSELTAMIVIR PHOSPHATE 75 MG PO CAPS: 75.0000 mg | ORAL_CAPSULE | Freq: Two times a day (BID) | ORAL | 0 refills | Status: DC

## 2023-04-30 NOTE — Progress Notes (Signed)
 Virtual Visit via Video Note  I connected with Katie Martinez on 04/30/23 at  9:40 AM EST by a video enabled telemedicine application and verified that I am speaking with the correct person using two identifiers.  Location: Patient: Home Provider: Office  Person's participating in this video call: Katie Reaper, NP-C and Katie Martinez   I discussed the limitations of evaluation and management by telemedicine and the availability of in person appointments. The patient expressed understanding and agreed to proceed.  History of Present Illness:   Discussed the use of AI scribe software for clinical note transcription with the patient, who gave verbal consent to proceed.   Katie Martinez is a 63 year old female who presents with a runny nose, sore throat and cough following exposure to her husband's illness.  She developed a sore throat and a dry cough after her husband fell ill with a high fever of 103.45F. He was diagnosed with influenza and treated with Tamiflu, which improved his condition.  Her cough is dry. No trouble swallowing or productive cough. She has a runny nose. No headache, nasal congestion, shortness of breath, nausea, vomiting, or diarrhea. She has not taken any over-the-counter medications for her symptoms.  She is currently in [place], having traveled from [place] after attending a wedding.      Past Medical History:  Diagnosis Date   Hyperlipidemia    Hypertension     Current Outpatient Medications  Medication Sig Dispense Refill   amLODipine (NORVASC) 5 MG tablet TAKE 1 TABLET (5 MG TOTAL) BY MOUTH DAILY. 90 tablet 1   BIOTIN PO Take by mouth.     Cholecalciferol (VITAMIN D) 50 MCG (2000 UT) CAPS Take by mouth.     LORazepam (ATIVAN) 0.5 MG tablet Take 1 tablet (0.5 mg total) by mouth daily as needed for anxiety. 20 tablet 0   olmesartan (BENICAR) 20 MG tablet TAKE 1 TABLET BY MOUTH EVERY DAY 90 tablet 0   ondansetron (ZOFRAN ODT) 4 MG  disintegrating tablet Take 1 tablet (4 mg total) by mouth every 8 (eight) hours as needed for nausea or vomiting. 20 tablet 0   rosuvastatin (CRESTOR) 10 MG tablet Take 1 tablet (10 mg total) by mouth daily. 90 tablet 1   No current facility-administered medications for this visit.    Allergies  Allergen Reactions   Lisinopril Other (See Comments)    Dry cough     Metoprolol Other (See Comments)    Vivid dreams, dizziness    Family History  Problem Relation Age of Onset   Lymphoma Mother    Hypertension Mother    Hyperlipidemia Mother    Arthritis Father    Hyperlipidemia Father    Hypertension Father    Stroke Father    Hyperlipidemia Brother    Hypertension Brother    Prostate cancer Brother    Skin cancer Brother    Hypertension Brother    Hyperlipidemia Brother    Prostate cancer Brother    Skin cancer Brother    Arthritis Maternal Grandmother    Lymphoma Maternal Grandmother    Stroke Maternal Grandmother    Brain cancer Maternal Grandfather    Hypertension Maternal Grandfather    Hyperlipidemia Maternal Grandfather    Heart disease Maternal Grandfather    Heart disease Paternal Grandmother    Hyperlipidemia Paternal Grandmother    Hypertension Paternal Grandmother    Stroke Paternal Grandfather    Arthritis Paternal Grandfather    Breast cancer Maternal Aunt  Breast cancer Maternal Aunt     Social History   Socioeconomic History   Marital status: Married    Spouse name: Not on file   Number of children: Not on file   Years of education: Not on file   Highest education level: Associate degree: academic program  Occupational History   Not on file  Tobacco Use   Smoking status: Never   Smokeless tobacco: Never  Vaping Use   Vaping status: Never Used  Substance and Sexual Activity   Alcohol use: Yes    Comment: 2 glasses at night   Drug use: Never   Sexual activity: Not on file  Other Topics Concern   Not on file  Social History Narrative    Not on file   Social Drivers of Health   Financial Resource Strain: Low Risk  (02/18/2023)   Overall Financial Resource Strain (CARDIA)    Difficulty of Paying Living Expenses: Not hard at all  Food Insecurity: No Food Insecurity (02/18/2023)   Hunger Vital Sign    Worried About Running Out of Food in the Last Year: Never true    Ran Out of Food in the Last Year: Never true  Transportation Needs: No Transportation Needs (02/18/2023)   PRAPARE - Administrator, Civil Service (Medical): No    Lack of Transportation (Non-Medical): No  Physical Activity: Insufficiently Active (02/18/2023)   Exercise Vital Sign    Days of Exercise per Week: 3 days    Minutes of Exercise per Session: 20 min  Stress: No Stress Concern Present (02/18/2023)   [employer] of Occupational Health - Occupational Stress Questionnaire    Feeling of Stress : Not at all  Social Connections: Socially Integrated (02/18/2023)   Social Connection and Isolation Panel [NHANES]    Frequency of Communication with Friends and Family: More than three times a week    Frequency of Social Gatherings with Friends and Family: Once a week    Attends Religious Services: 1 to 4 times per year    Active Member of [employer] or Organizations: Yes    Attends Engineer, structural: More than 4 times per year    Marital Status: Married  Catering manager Violence: Not on file     Constitutional: Denies fever, malaise, fatigue, headache or abrupt weight changes.  HEENT: Pt reports runny nose, sore throat. Denies eye pain, eye redness, ear pain, ringing in the ears, wax buildup, nasal congestion, bloody nose. Respiratory: Pt reports cough. Denies difficulty breathing, shortness of breath, or sputum production.   Cardiovascular: Denies chest pain, chest tightness, palpitations or swelling in the hands or feet.  Gastrointestinal: Denies abdominal pain, bloating, constipation, diarrhea or blood in the stool.  GU: Denies  urgency, frequency, pain with urination, burning sensation, blood in urine, odor or discharge. Musculoskeletal: Denies decrease in range of motion, difficulty with gait, muscle pain or joint pain and swelling.  Skin: Denies redness, rashes, lesions or ulcercations.  Neurological: Denies dizziness, difficulty with memory, difficulty with speech or problems with balance and coordination.  Psych: Denies anxiety, depression, SI/HI.  No other specific complaints in a complete review of systems (except as listed in HPI above).  Observations/Objective:   Wt Readings from Last 3 Encounters:  02/20/23 105 lb (47.6 kg)  11/25/22 104 lb (47.2 kg)  08/19/22 107 lb (48.5 kg)    General: Appears her stated age, well developed, well nourished in NAD HEENT: Head: normal shape and size; Nose: Congestion noted; Throat/Mouth: Hoarseness  noted Pulmonary/Chest: Normal effort. No respiratory distress.  Neurological: Alert and oriented.     BMET    Component Value Date/Time   NA 140 02/20/2023 0910   K 4.1 02/20/2023 0910   CL 104 02/20/2023 0910   CO2 28 02/20/2023 0910   GLUCOSE 87 02/20/2023 0910   BUN 15 02/20/2023 0910   CREATININE 0.68 02/20/2023 0910   CALCIUM 9.5 02/20/2023 0910   GFRNONAA >60 11/18/2020 1523    Lipid Panel     Component Value Date/Time   CHOL 249 (H) 02/20/2023 0910   TRIG 111 02/20/2023 0910   HDL 68 02/20/2023 0910   CHOLHDL 3.7 02/20/2023 0910   VLDL 30.0 02/08/2020 1541   LDLCALC 158 (H) 02/20/2023 0910    CBC    Component Value Date/Time   WBC 4.8 02/20/2023 0910   RBC 4.03 02/20/2023 0910   HGB 13.3 02/20/2023 0910   HCT 39.3 02/20/2023 0910   PLT 245 02/20/2023 0910   MCV 97.5 02/20/2023 0910   MCH 33.0 02/20/2023 0910   MCHC 33.8 02/20/2023 0910   RDW 11.6 02/20/2023 0910   LYMPHSABS 1.2 11/18/2020 1523   MONOABS 0.5 11/18/2020 1523   EOSABS 0.0 11/18/2020 1523   BASOSABS 0.0 11/18/2020 1523    Hgb A1C Lab Results  Component Value Date    HGBA1C 5.1 02/20/2023       Assessment and Plan:   Assessment and Plan    Exposure to influenza Early symptoms of sore throat, cough, and runny nose. No difficulty swallowing, productive cough, headache, nasal congestion, shortness of breath, or gastrointestinal symptoms. Close contact with husband who has confirmed influenza. -Prescribe Tamiflu 75mg  twice daily for 5 days. -Advise rest, hydration, covering cough, and use of Tylenol or ibuprofen as needed for fever. -[facility] [address] in [place], [place].     RTC in 4 months for follow-up of chronic conditions  Follow Up Instructions:    I discussed the assessment and treatment plan with the patient. The patient was provided an opportunity to ask questions and all were answered. The patient agreed with the plan and demonstrated an understanding of the instructions.   The patient was advised to call back or seek an in-person evaluation if the symptoms worsen or if the condition fails to improve as anticipated.   Katie Reaper, NP

## 2023-04-30 NOTE — Patient Instructions (Signed)

## 2023-05-01 ENCOUNTER — Other Ambulatory Visit: Payer: Self-pay | Admitting: Internal Medicine

## 2023-05-01 ENCOUNTER — Other Ambulatory Visit: Payer: Self-pay

## 2023-05-01 NOTE — Telephone Encounter (Signed)
 Requested Prescriptions  Pending Prescriptions Disp Refills   olmesartan (BENICAR) 20 MG tablet [Pharmacy Med Name: OLMESARTAN MEDOXOMIL 20 MG TAB] 90 tablet 1    Sig: TAKE 1 TABLET BY MOUTH EVERY DAY     Cardiovascular:  Angiotensin Receptor Blockers Passed - 05/01/2023  5:01 PM      Passed - Cr in normal range and within 180 days    Creat  Date Value Ref Range Status  02/20/2023 0.68 0.50 - 1.05 mg/dL Final         Passed - K in normal range and within 180 days    Potassium  Date Value Ref Range Status  02/20/2023 4.1 3.5 - 5.3 mmol/L Final         Passed - Patient is not pregnant      Passed - Last BP in normal range    BP Readings from Last 1 Encounters:  02/20/23 128/80         Passed - Valid encounter within last 6 months    Recent Outpatient Visits           2 months ago Encounter for general adult medical examination with abnormal findings   [facility] [place], Salvadore Oxford, NP   5 months ago Need for influenza vaccination   [place] [facility] [place], Salvadore Oxford, NP   8 months ago Pure hypercholesterolemia   [place] [facility] [place], Salvadore Oxford, NP   1 year ago Encounter for general adult medical examination w/o abnormal findings   [place] [facility] [place], Salvadore Oxford, NP   1 year ago Acute bronchitis, unspecified organism   [facility] [place], Salvadore Oxford, NP       Future Appointments             In 3 months Baity, Salvadore Oxford, NP  [facility], [facility]

## 2023-05-22 ENCOUNTER — Other Ambulatory Visit: Payer: Self-pay | Admitting: Internal Medicine

## 2023-05-22 NOTE — Telephone Encounter (Signed)
 Too soon for refill, last refill 04/23/23 for 90 and 1 refill.  Requested Prescriptions  Pending Prescriptions Disp Refills   rosuvastatin (CRESTOR) 10 MG tablet [Pharmacy Med Name: ROSUVASTATIN CALCIUM 10 MG TAB] 45 tablet     Sig: TAKE 1 TABLET BY MOUTH EVERY OTHER DAY     Cardiovascular:  Antilipid - Statins 2 Failed - 05/22/2023  4:02 PM      Failed - Lipid Panel in normal range within the last 12 months    Cholesterol  Date Value Ref Range Status  02/20/2023 249 (H) <200 mg/dL Final   LDL Cholesterol (Calc)  Date Value Ref Range Status  02/20/2023 158 (H) mg/dL (calc) Final    Comment:    Reference range: <100 . Desirable range <100 mg/dL for primary prevention;   <70 mg/dL for patients with CHD or diabetic patients  with > or = 2 CHD risk factors. Marland Kitchen LDL-C is now calculated using the Martin-Hopkins  calculation, which is a validated novel method providing  better accuracy than the Friedewald equation in the  estimation of LDL-C.  Horald Pollen et al. Lenox Ahr. 1027;253(66): 2061-2068  (http://education.Quest[facility].com/faq/FAQ164)    HDL  Date Value Ref Range Status  02/20/2023 68 > OR = 50 mg/dL Final   Triglycerides  Date Value Ref Range Status  02/20/2023 111 <150 mg/dL Final         Passed - Cr in normal range and within 360 days    Creat  Date Value Ref Range Status  02/20/2023 0.68 0.50 - 1.05 mg/dL Final         Passed - Patient is not pregnant      Passed - Valid encounter within last 12 months    Recent Outpatient Visits           3 months ago Encounter for general adult medical examination with abnormal findings   [facility] [place], Salvadore Oxford, NP   5 months ago Need for influenza vaccination   [facility] [place], Salvadore Oxford, NP   9 months ago Pure hypercholesterolemia   [facility] [place], Salvadore Oxford, NP   1 year ago Encounter for general adult medical examination w/o  abnormal findings   [place] [facility] [place], Salvadore Oxford, NP   1 year ago Acute bronchitis, unspecified organism   [facility] [place], Salvadore Oxford, NP       Future Appointments             In 3 months Baity, Salvadore Oxford, NP [facility], [facility]

## 2023-06-02 DIAGNOSIS — M7701 Medial epicondylitis, right elbow: Secondary | ICD-10-CM | POA: Diagnosis not present

## 2023-06-24 DIAGNOSIS — M7701 Medial epicondylitis, right elbow: Secondary | ICD-10-CM | POA: Diagnosis not present

## 2023-08-21 ENCOUNTER — Encounter: Payer: Self-pay | Admitting: Internal Medicine

## 2023-08-21 ENCOUNTER — Ambulatory Visit (INDEPENDENT_AMBULATORY_CARE_PROVIDER_SITE_OTHER): Payer: Self-pay | Admitting: Internal Medicine

## 2023-08-21 VITALS — BP 118/72 | Ht 60.0 in | Wt 105.0 lb

## 2023-08-21 DIAGNOSIS — F419 Anxiety disorder, unspecified: Secondary | ICD-10-CM

## 2023-08-21 DIAGNOSIS — I10 Essential (primary) hypertension: Secondary | ICD-10-CM

## 2023-08-21 DIAGNOSIS — N952 Postmenopausal atrophic vaginitis: Secondary | ICD-10-CM

## 2023-08-21 DIAGNOSIS — E7800 Pure hypercholesterolemia, unspecified: Secondary | ICD-10-CM | POA: Diagnosis not present

## 2023-08-21 DIAGNOSIS — R739 Hyperglycemia, unspecified: Secondary | ICD-10-CM

## 2023-08-21 DIAGNOSIS — Z0001 Encounter for general adult medical examination with abnormal findings: Secondary | ICD-10-CM

## 2023-08-21 DIAGNOSIS — F32A Depression, unspecified: Secondary | ICD-10-CM

## 2023-08-21 DIAGNOSIS — M8580 Other specified disorders of bone density and structure, unspecified site: Secondary | ICD-10-CM | POA: Insufficient documentation

## 2023-08-21 DIAGNOSIS — M8589 Other specified disorders of bone density and structure, multiple sites: Secondary | ICD-10-CM

## 2023-08-21 MED ORDER — ONDANSETRON 4 MG PO TBDP: 4.0000 mg | ORAL_TABLET | Freq: Three times a day (TID) | ORAL | 0 refills | Status: DC | PRN

## 2023-08-21 MED ORDER — AMLODIPINE-OLMESARTAN 5-20 MG PO TABS: 1.0000 | ORAL_TABLET | Freq: Every day | ORAL | 1 refills | Status: DC

## 2023-08-21 NOTE — Patient Instructions (Signed)

## 2023-08-21 NOTE — Assessment & Plan Note (Signed)
 C-Met lipid profile today Encourage low-fat diet Continue rosuvastatin  10 mg daily, avoid alcohol use

## 2023-08-21 NOTE — Assessment & Plan Note (Signed)
 Not medicated Will monitor

## 2023-08-21 NOTE — Progress Notes (Signed)
 Subjective:    Patient ID: Katie Martinez, female    DOB: Sep 22, 1960, 63 y.o.   MRN: [id number]  HPI  Patient presents to clinic today for 18-month follow-up of chronic conditions.  HTN: Her BP today is 118/72.  She is taking olmesartan  and amlodipine  as prescribed.  ECG from 04/2021 reviewed.  Anxiety and depression: Chronic, managed on lorazepam  as needed.  She is not currently seeing a therapist.  She denies SI/HI.  HLD: Her last LDL was 158, triglycerides 111, 02/2023.  She denies myalgias on rosuvastatin , but did have myalgias with lovastatin , simvistatin.  She has noticed some nausea, vomiting and diarrhea particularly when she drinks alcohol and feels like this is attributed to the rosuvastatin .  She tries to consume low-fat diet.  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  She does not follow with GYN.  Osteopenia: She is taking calcium  and vitamin D OTC.  She does get weightbearing exercise daily.  Bone density from 12/2022 reviewed.  Review of Systems  Past Medical History:  Diagnosis Date   Hyperlipidemia    Hypertension     Current Outpatient Medications  Medication Sig Dispense Refill   amLODipine  (NORVASC ) 5 MG tablet TAKE 1 TABLET (5 MG TOTAL) BY MOUTH DAILY. 90 tablet 1   BIOTIN PO Take by mouth.     Cholecalciferol (VITAMIN D) 50 MCG (2000 UT) CAPS Take by mouth.     LORazepam  (ATIVAN ) 0.5 MG tablet Take 1 tablet (0.5 mg total) by mouth daily as needed for anxiety. 20 tablet 0   olmesartan  (BENICAR ) 20 MG tablet TAKE 1 TABLET BY MOUTH EVERY DAY 90 tablet 1   ondansetron  (ZOFRAN  ODT) 4 MG disintegrating tablet Take 1 tablet (4 mg total) by mouth every 8 (eight) hours as needed for nausea or vomiting. 20 tablet 0   oseltamivir  (TAMIFLU ) 75 MG capsule Take 1 capsule (75 mg total) by mouth 2 (two) times daily. For 5 days 10 capsule 0   rosuvastatin  (CRESTOR ) 10 MG tablet Take 1  tablet (10 mg total) by mouth daily. 90 tablet 1   No current facility-administered medications for this visit.    Allergies  Allergen Reactions   Lisinopril Other (See Comments)    Dry cough     Metoprolol Other (See Comments)    Vivid dreams, dizziness    Family History  Problem Relation Age of Onset   Lymphoma Mother    Hypertension Mother    Hyperlipidemia Mother    Arthritis Father    Hyperlipidemia Father    Hypertension Father    Stroke Father    Hyperlipidemia Brother    Hypertension Brother    Prostate cancer Brother    Skin cancer Brother    Hypertension Brother    Hyperlipidemia Brother    Prostate cancer Brother    Skin cancer Brother    Arthritis Maternal Grandmother    Lymphoma Maternal Grandmother    Stroke Maternal Grandmother    Brain cancer Maternal Grandfather    Hypertension Maternal Grandfather    Hyperlipidemia Maternal Grandfather    Heart disease Maternal Grandfather    Heart disease Paternal Grandmother    Hyperlipidemia Paternal Grandmother    Hypertension Paternal Grandmother    Stroke Paternal Grandfather    Arthritis Paternal Grandfather    Breast cancer Maternal Aunt    Breast cancer Maternal Aunt     Social History   Socioeconomic  History   Marital status: Married    Spouse name: Not on file   Number of children: Not on file   Years of education: Not on file   Highest education level: Associate degree: academic program  Occupational History   Not on file  Tobacco Use   Smoking status: Never   Smokeless tobacco: Never  Vaping Use   Vaping status: Never Used  Substance and Sexual Activity   Alcohol use: Yes    Comment: 2 glasses at night   Drug use: Never   Sexual activity: Not on file  Other Topics Concern   Not on file  Social History Narrative   Not on file   Social Drivers of Health   Financial Resource Strain: Low Risk  (08/17/2023)   Overall Financial Resource Strain (CARDIA)    Difficulty of Paying Living  Expenses: Not very hard  Food Insecurity: No Food Insecurity (08/17/2023)   Hunger Vital Sign    Worried About Running Out of Food in the Last Year: Never true    Ran Out of Food in the Last Year: Never true  Transportation Needs: No Transportation Needs (08/17/2023)   PRAPARE - Administrator, Civil Service (Medical): No    Lack of Transportation (Non-Medical): No  Physical Activity: Insufficiently Active (08/17/2023)   Exercise Vital Sign    Days of Exercise per Week: 2 days    Minutes of Exercise per Session: 20 min  Stress: No Stress Concern Present (08/17/2023)   [employer] of Occupational Health - Occupational Stress Questionnaire    Feeling of Stress: Only a little  Social Connections: Socially Integrated (08/17/2023)   Social Connection and Isolation Panel    Frequency of Communication with Friends and Family: More than three times a week    Frequency of Social Gatherings with Friends and Family: Once a week    Attends Religious Services: 1 to 4 times per year    Active Member of [employer] or Organizations: Yes    Attends Engineer, structural: More than 4 times per year    Marital Status: Married  Catering manager Violence: Not on file     Constitutional: Denies fever, malaise, fatigue, headache or abrupt weight changes.  HEENT: Denies eye pain, eye redness, ear pain, ringing in the ears, wax buildup, runny nose, nasal congestion, bloody nose, or sore throat. Respiratory: Denies difficulty breathing, shortness of breath, cough or sputum production.   Cardiovascular: Denies chest pain, chest tightness, palpitations or swelling in the hands or feet.  Gastrointestinal: Patient reports intermittent nausea, vomiting and diarrhea.  Denies abdominal pain, bloating, constipation, or blood in the stool.  GU: Denies urgency, frequency, pain with urination, burning sensation, blood in urine, odor or discharge. Musculoskeletal: Denies decrease in range of motion,  difficulty with gait, muscle pain or joint pain and swelling.  Skin: Denies redness, rashes, lesions or ulcercations.  Neurological: Denies dizziness, difficulty with memory, difficulty with speech or problems with balance and coordination.  Psych: Patient has a history of anxiety and depression.  Denies SI/HI.  No other specific complaints in a complete review of systems (except as listed in HPI above).     Objective:   Physical Exam  BP 118/72 (BP Location: Left Arm, Patient Position: Sitting, Cuff Size: Normal)   Ht 5' (1.524 m)   Wt 105 lb (47.6 kg)   BMI 20.51 kg/m    Wt Readings from Last 3 Encounters:  02/20/23 105 lb (47.6 kg)  11/25/22 104  lb (47.2 kg)  08/19/22 107 lb (48.5 kg)    General: Appears her stated age, well developed, well nourished in NAD. Skin: Warm, dry and intact.  HEENT: Head: normal shape and size; Eyes: sclera white, no icterus, conjunctiva pink, PERRLA and EOMs intact;  Cardiovascular: Normal rate and rhythm. S1,S2 noted.  No murmur, rubs or gallops noted. No JVD or BLE edema. No carotid bruits noted. Pulmonary/Chest: Normal effort and positive vesicular breath sounds. No respiratory distress. No wheezes, rales or ronchi noted.  Musculoskeletal: No difficulty with gait.  Neurological: Alert and oriented. Coordination normal.  Psychiatric: Mood and affect normal. Behavior is normal. Judgment and thought content normal.     BMET    Component Value Date/Time   NA 140 02/20/2023 0910   K 4.1 02/20/2023 0910   CL 104 02/20/2023 0910   CO2 28 02/20/2023 0910   GLUCOSE 87 02/20/2023 0910   BUN 15 02/20/2023 0910   CREATININE 0.68 02/20/2023 0910   CALCIUM  9.5 02/20/2023 0910   GFRNONAA >60 11/18/2020 1523    Lipid Panel     Component Value Date/Time   CHOL 249 (H) 02/20/2023 0910   TRIG 111 02/20/2023 0910   HDL 68 02/20/2023 0910   CHOLHDL 3.7 02/20/2023 0910   VLDL 30.0 02/08/2020 1541   LDLCALC 158 (H) 02/20/2023 0910    CBC     Component Value Date/Time   WBC 4.8 02/20/2023 0910   RBC 4.03 02/20/2023 0910   HGB 13.3 02/20/2023 0910   HCT 39.3 02/20/2023 0910   PLT 245 02/20/2023 0910   MCV 97.5 02/20/2023 0910   MCH 33.0 02/20/2023 0910   MCHC 33.8 02/20/2023 0910   RDW 11.6 02/20/2023 0910   LYMPHSABS 1.2 11/18/2020 1523   MONOABS 0.5 11/18/2020 1523   EOSABS 0.0 11/18/2020 1523   BASOSABS 0.0 11/18/2020 1523    Hgb A1C Lab Results  Component Value Date   HGBA1C 5.1 02/20/2023           Assessment & Plan:     RTC in 6 months for your annual exam Helayne Lo, NP

## 2023-08-21 NOTE — Assessment & Plan Note (Signed)
 Controlled on amlodipine -olmesartan  5-20 mg daily Reinforced DASH diet C-Met today

## 2023-08-21 NOTE — Assessment & Plan Note (Signed)
Continue calcium and vitamin D OTC Encourage daily weightbearing exercise 

## 2023-08-21 NOTE — Assessment & Plan Note (Signed)
 Rare use of lorazepam  0.5 mg daily as needed, will monitor Support offered

## 2023-08-22 LAB — HEMOGLOBIN A1C
Hgb A1c MFr Bld: 5.4 % (ref <5.7)
Mean Plasma Glucose: 108 mg/dL
eAG (mmol/L): 6 mmol/L

## 2023-08-22 LAB — CBC
HCT: 40.1 % (ref 35.0–45.0)
Hemoglobin: 13.3 g/dL (ref 11.7–15.5)
MCH: 33.3 pg — ABNORMAL HIGH (ref 27.0–33.0)
MCHC: 33.2 g/dL (ref 32.0–36.0)
MCV: 100.3 fL — ABNORMAL HIGH (ref 80.0–100.0)
MPV: 9.7 fL (ref 7.5–12.5)
Platelets: 228 10*3/uL (ref 140–400)
RBC: 4 10*6/uL (ref 3.80–5.10)
RDW: 11.8 % (ref 11.0–15.0)
WBC: 5 10*3/uL (ref 3.8–10.8)

## 2023-08-22 LAB — LIPID PANEL
Cholesterol: 171 mg/dL (ref <200)
HDL: 67 mg/dL (ref >=50)
LDL Cholesterol (Calc): 83 mg/dL
Non-HDL Cholesterol (Calc): 104 mg/dL (ref <130)
Total CHOL/HDL Ratio: 2.6 (calc) (ref <5.0)
Triglycerides: 117 mg/dL (ref <150)

## 2023-08-22 LAB — COMPREHENSIVE METABOLIC PANEL WITH GFR
AG Ratio: 2 (calc) (ref 1.0–2.5)
ALT: 24 U/L (ref 6–29)
AST: 21 U/L (ref 10–35)
Albumin: 4.9 g/dL (ref 3.6–5.1)
Alkaline phosphatase (APISO): 37 U/L (ref 37–153)
BUN: 12 mg/dL (ref 7–25)
CO2: 26 mmol/L (ref 20–32)
Calcium: 9.7 mg/dL (ref 8.6–10.4)
Chloride: 105 mmol/L (ref 98–110)
Creat: 0.81 mg/dL (ref 0.50–1.05)
Globulin: 2.4 g/dL (ref 1.9–3.7)
Glucose, Bld: 87 mg/dL (ref 65–99)
Potassium: 4.8 mmol/L (ref 3.5–5.3)
Sodium: 141 mmol/L (ref 135–146)
Total Bilirubin: 0.6 mg/dL (ref 0.2–1.2)
Total Protein: 7.3 g/dL (ref 6.1–8.1)
eGFR: 81 mL/min/{1.73_m2} (ref >=60)

## 2023-08-24 ENCOUNTER — Ambulatory Visit: Payer: Self-pay | Admitting: Internal Medicine

## 2023-09-25 ENCOUNTER — Telehealth: Payer: Self-pay | Admitting: Internal Medicine

## 2023-09-25 NOTE — Telephone Encounter (Signed)
 New patient has an appointment with Dr. Santo on 12/10/2023.  We have requested records from Dr. Neville Novak at [facility].  Release of information has been scanned into chart.

## 2023-11-04 ENCOUNTER — Other Ambulatory Visit: Payer: Self-pay | Admitting: Internal Medicine

## 2023-11-04 NOTE — Telephone Encounter (Signed)
 Requested Prescriptions  Pending Prescriptions Disp Refills   rosuvastatin  (CRESTOR ) 10 MG tablet [Pharmacy Med Name: ROSUVASTATIN  CALCIUM  10 MG TAB] 90 tablet 0    Sig: TAKE 1 TABLET BY MOUTH EVERY DAY     Cardiovascular:  Antilipid - Statins 2 Failed - 11/04/2023  2:41 PM      Failed - Lipid Panel in normal range within the last 12 months    Cholesterol  Date Value Ref Range Status  08/21/2023 171 <200 mg/dL Final   LDL Cholesterol (Calc)  Date Value Ref Range Status  08/21/2023 83 mg/dL (calc) Final    Comment:    Reference range: <100 . Desirable range <100 mg/dL for primary prevention;   <70 mg/dL for patients with CHD or diabetic patients  with > or = 2 CHD risk factors. SABRA LDL-C is now calculated using the Martin-Hopkins  calculation, which is a validated novel method providing  better accuracy than the Friedewald equation in the  estimation of LDL-C.  Gladis APPLETHWAITE et al. SANDREA. 7986;689(80): 2061-2068  (http://education.[facility].com/faq/FAQ164)    HDL  Date Value Ref Range Status  08/21/2023 67 > OR = 50 mg/dL Final   Triglycerides  Date Value Ref Range Status  08/21/2023 117 <150 mg/dL Final         Passed - Cr in normal range and within 360 days    Creat  Date Value Ref Range Status  08/21/2023 0.81 0.50 - 1.05 mg/dL Final         Passed - Patient is not pregnant      Passed - Valid encounter within last 12 months    Recent Outpatient Visits           2 months ago Pure hypercholesterolemia   [place] [place], Angeline ORN, NP   6 months ago Exposure to influenza   [facility] [place], Angeline ORN, NP       Future Appointments             In 1 month Santo, Stanly LABOR, MD [facility] A Dept of [employer]. [facility] [employer], [employer]

## 2023-11-24 ENCOUNTER — Encounter: Payer: Self-pay | Admitting: Internal Medicine

## 2023-11-24 MED ORDER — OLMESARTAN MEDOXOMIL 20 MG PO TABS: 20.0000 mg | ORAL_TABLET | Freq: Every day | ORAL | 0 refills | Status: DC

## 2023-11-24 MED ORDER — AMLODIPINE BESYLATE 5 MG PO TABS: 5.0000 mg | ORAL_TABLET | Freq: Every day | ORAL | 0 refills | Status: DC

## 2023-12-10 ENCOUNTER — Ambulatory Visit: Admitting: Internal Medicine

## 2023-12-14 NOTE — Progress Notes (Signed)
  Cardiology Office Note:   Date:  12/14/2023  ID:  Katie Martinez, DOB 1960-05-22, MRN [id number] PCP: Antonette Angeline ORN, NP  [facility] Providers Cardiologist:  None { Chief Complaint: No chief complaint on file.     History of Present Illness:   Katie Martinez is a 63 y.o. female with a PMH of HTN and HLD who presents as a new patient via self-referral for ***.   Past Medical History:  Diagnosis Date   Hyperlipidemia    Hypertension      Studies Reviewed:    EKG: ***           Risk Assessment/Calculations:   {Does this patient have ATRIAL FIBRILLATION?:[phone]} No BP recorded.  {Refresh Note OR Click here to enter BP  :1}***        Physical Exam:     VS:  There were no vitals taken for this visit. ***    Wt Readings from Last 3 Encounters:  08/21/23 105 lb (47.6 kg)  02/20/23 105 lb (47.6 kg)  11/25/22 104 lb (47.2 kg)     GEN: Well nourished, well developed, in no acute distress NECK: No JVD; No carotid bruits CARDIAC: ***RRR, no murmurs, rubs, gallops RESPIRATORY:  Clear to auscultation without rales, wheezing or rhonchi  ABDOMEN: Soft, non-tender, non-distended, normal bowel sounds EXTREMITIES:  Warm and well perfused, no edema; No deformity, 2+ radial pulses PSYCH: Normal mood and affect   Assessment & Plan       {Are you ordering a CV Procedure (e.g. stress test, cath, DCCV, TEE, etc)?   Press F2        :[id number]}   This note was written with the assistance of a dictation microphone or AI dictation software. Please excuse any typos or grammatical errors.   Signed, Georganna Archer, MD 12/14/2023 8:29 PM    [facility]

## 2023-12-15 ENCOUNTER — Encounter: Payer: Self-pay | Admitting: Student in an Organized Health Care Education/Training Program

## 2023-12-15 ENCOUNTER — Ambulatory Visit
Attending: Student in an Organized Health Care Education/Training Program | Admitting: Student in an Organized Health Care Education/Training Program

## 2023-12-15 ENCOUNTER — Other Ambulatory Visit (HOSPITAL_COMMUNITY): Payer: Self-pay

## 2023-12-15 VITALS — BP 128/70 | HR 70 | Resp 16 | Ht 60.0 in | Wt 106.4 lb

## 2023-12-15 DIAGNOSIS — R0609 Other forms of dyspnea: Secondary | ICD-10-CM | POA: Diagnosis not present

## 2023-12-15 DIAGNOSIS — R0789 Other chest pain: Secondary | ICD-10-CM | POA: Diagnosis not present

## 2023-12-15 DIAGNOSIS — I10 Essential (primary) hypertension: Secondary | ICD-10-CM

## 2023-12-15 DIAGNOSIS — E7800 Pure hypercholesterolemia, unspecified: Secondary | ICD-10-CM | POA: Diagnosis not present

## 2023-12-15 MED ORDER — BEMPEDOIC ACID 180 MG PO TABS
180.0000 mg | ORAL_TABLET | Freq: Every day | ORAL | 3 refills | Status: DC
  Filled 2023-12-15: qty 30, 30d supply, fill #0

## 2023-12-15 NOTE — Assessment & Plan Note (Signed)
 I reviewed her blood pressure log from home and the majority of her blood pressures look very good.  I think her more elevated blood pressure readings are in the setting of feeling anxious after checking her blood pressure multiple times consecutively.  I counseled her to check her blood pressure no more than twice a day and to continue taking her blood pressure medicine as prescribed.  Her blood pressure average is truly not at goal despite these changes then we can consider adjusting her medications as needed. -Continue current regiment

## 2023-12-15 NOTE — Assessment & Plan Note (Signed)
 Patient has elevated LDL historically, but thankfully has a CAC score equal to 0 as of 2017.  She is not particular high risk for CAD; however, it is obviously not great for her to have ongoing exposure to elevated LDL levels.  Ideally her LDL would be <100.  She has had intolerances to many lipid-lowering therapies within the statin family and Repatha .  I counseled her on a variety of different options including trying Praluent vs bempedoic acid vs Zetia monotherapy.  We ultimately settled on bempedoic acid for treatment.  We will recheck her cholesterol in 3 months to assess efficacy. -Start bempedoic acid 180 mg daily -Lipid panel in 3 months -Follow-up in 6 months

## 2023-12-15 NOTE — Patient Instructions (Signed)
 Medication Instructions:  START Bempedoic acid 180 mg daily   *If you need a refill on your cardiac medications before your next appointment, please call your pharmacy*  Lab Work: LIPID PANEL IN 3 MONTHS   If you have labs (blood work) drawn today and your tests are completely normal, you will receive your results only by: MyChart Message (if you have MyChart) OR A paper copy in the mail If you have any lab test that is abnormal or we need to change your treatment, we will call you to review the results.  Testing/Procedures: ECHOCARDIOGRAM  Your physician has requested that you have an echocardiogram. Echocardiography is a painless test that uses sound waves to create images of your heart. It provides your doctor with information about the size and shape of your heart and how well your heart's chambers and valves are working. This procedure takes approximately one hour. There are no restrictions for this procedure. Please do NOT wear cologne, perfume, aftershave, or lotions (deodorant is allowed). Please arrive 15 minutes prior to your appointment time.  Please note: We ask at that you not bring children with you during ultrasound (echo/ vascular) testing. Due to room size and safety concerns, children are not allowed in the ultrasound rooms during exams. Our front office staff cannot provide observation of children in our lobby area while testing is being conducted. An adult accompanying a patient to their appointment will only be allowed in the ultrasound room at the discretion of the ultrasound technician under special circumstances. We apologize for any inconvenience.   Follow-Up: At [facility], you and your health needs are our priority.  As part of our continuing mission to provide you with exceptional heart care, our providers are all part of one team.  This team includes your primary Cardiologist (physician) and Advanced Practice Providers or APPs (Physician Assistants and  Nurse Practitioners) who all work together to provide you with the care you need, when you need it.  Your next appointment:   6 month(s)  Provider:   Georganna Archer, MD

## 2023-12-28 ENCOUNTER — Encounter: Payer: Self-pay | Admitting: Internal Medicine

## 2023-12-29 ENCOUNTER — Telehealth: Payer: Self-pay | Admitting: Student in an Organized Health Care Education/Training Program

## 2023-12-29 ENCOUNTER — Telehealth (INDEPENDENT_AMBULATORY_CARE_PROVIDER_SITE_OTHER): Admitting: Internal Medicine

## 2023-12-29 DIAGNOSIS — E7800 Pure hypercholesterolemia, unspecified: Secondary | ICD-10-CM

## 2023-12-29 DIAGNOSIS — R42 Dizziness and giddiness: Secondary | ICD-10-CM | POA: Diagnosis not present

## 2023-12-29 DIAGNOSIS — Z789 Other specified health status: Secondary | ICD-10-CM

## 2023-12-29 DIAGNOSIS — R110 Nausea: Secondary | ICD-10-CM | POA: Diagnosis not present

## 2023-12-29 DIAGNOSIS — R0789 Other chest pain: Secondary | ICD-10-CM

## 2023-12-29 DIAGNOSIS — R011 Cardiac murmur, unspecified: Secondary | ICD-10-CM

## 2023-12-29 MED ORDER — ONDANSETRON 4 MG PO TBDP: 4.0000 mg | ORAL_TABLET | Freq: Three times a day (TID) | ORAL | 0 refills | Status: AC | PRN

## 2023-12-29 NOTE — Telephone Encounter (Signed)
 Pt requesting an order for ct cardiac score. Please advise.

## 2023-12-29 NOTE — Progress Notes (Signed)
 Virtual Visit via Video Note  I connected with Katie Martinez on 12/29/23 at 11:00 AM EDT by a video enabled telemedicine application and verified that I am speaking with the correct person using two identifiers.  Location: Patient: Home Provider: Office  Person's participating in this video call: Angeline Laura, NP-C and Shalaine Payson.   I discussed the limitations of evaluation and management by telemedicine and the availability of in person appointments. The patient expressed understanding and agreed to proceed.  History of Present Illness:   Discussed the use of AI scribe software for clinical note transcription with the patient, who gave verbal consent to proceed.  Katie Martinez is a 63 year old female who presents with episodes of dizziness and vertigo.  She experienced the onset of dizziness and vertigo yesterday while sewing. Upon standing for lunch, she felt a sensation of the room spinning. She suspects dehydration might be a factor, as she often forgets to drink water when occupied.  She took meclizine 25 mg OTC with good relief of symptoms.  She has a history of intolerance to statins, including crestor , due to side effects such as nausea, diarrhea, weight gain, and headaches. She was switched to nexitol, a non-statin medication, but could not tolerate it either. She has previously tried repatha , which also did not agree with her. She is awaiting further evaluation with a CT coronary calcium  score and has an echocardiogram scheduled for next week for further evaluation of a heart murmur.     Past Medical History:  Diagnosis Date   Hyperlipidemia    Hypertension     Current Outpatient Medications  Medication Sig Dispense Refill   amLODipine  (NORVASC ) 5 MG tablet Take 1 tablet (5 mg total) by mouth daily. 90 tablet 0   Bempedoic Acid 180 MG TABS Take 1 tablet (180 mg total) by mouth daily. 90 tablet 3   BIOTIN PO Take by mouth.     Cholecalciferol (VITAMIN  D) 50 MCG (2000 UT) CAPS Take by mouth.     olmesartan  (BENICAR ) 20 MG tablet Take 1 tablet (20 mg total) by mouth daily. 90 tablet 0   ondansetron  (ZOFRAN  ODT) 4 MG disintegrating tablet Take 1 tablet (4 mg total) by mouth every 8 (eight) hours as needed for nausea or vomiting. 20 tablet 0   No current facility-administered medications for this visit.    Allergies  Allergen Reactions   Lisinopril Other (See Comments)    Dry cough     Metoprolol Other (See Comments)    Vivid dreams, dizziness    Family History  Problem Relation Age of Onset   Lymphoma Mother    Hypertension Mother    Hyperlipidemia Mother    Cancer Mother    Arthritis Father    Hyperlipidemia Father    Hypertension Father    Stroke Father    Hyperlipidemia Brother    Hypertension Brother    Prostate cancer Brother    Skin cancer Brother    Hypertension Brother    Hyperlipidemia Brother    Prostate cancer Brother    Skin cancer Brother    Arthritis Maternal Grandmother    Lymphoma Maternal Grandmother    Stroke Maternal Grandmother    Brain cancer Maternal Grandfather    Hypertension Maternal Grandfather    Hyperlipidemia Maternal Grandfather    Heart disease Maternal Grandfather    Heart disease Paternal Grandmother    Hyperlipidemia Paternal Grandmother    Hypertension Paternal Grandmother    Stroke Paternal Grandfather  Arthritis Paternal Grandfather    Breast cancer Maternal Aunt    Breast cancer Maternal Aunt     Social History   Socioeconomic History   Marital status: Married    Spouse name: Not on file   Number of children: Not on file   Years of education: Not on file   Highest education level: Associate degree: academic program  Occupational History   Not on file  Tobacco Use   Smoking status: Never   Smokeless tobacco: Never  Vaping Use   Vaping status: Never Used  Substance and Sexual Activity   Alcohol use: Yes    Comment: 2 glasses at night   Drug use: Never   Sexual  activity: Not on file  Other Topics Concern   Not on file  Social History Narrative   Not on file   Social Drivers of Health   Financial Resource Strain: Low Risk  (12/29/2023)   Overall Financial Resource Strain (CARDIA)    Difficulty of Paying Living Expenses: Not hard at all  Food Insecurity: No Food Insecurity (12/29/2023)   Hunger Vital Sign    Worried About Running Out of Food in the Last Year: Never true    Ran Out of Food in the Last Year: Never true  Transportation Needs: No Transportation Needs (12/29/2023)   PRAPARE - Administrator, Civil Service (Medical): No    Lack of Transportation (Non-Medical): No  Physical Activity: Insufficiently Active (12/29/2023)   Exercise Vital Sign    Days of Exercise per Week: 3 days    Minutes of Exercise per Session: 30 min  Stress: No Stress Concern Present (12/29/2023)   [employer] of Occupational Health - Occupational Stress Questionnaire    Feeling of Stress: Only a little  Social Connections: Socially Integrated (12/29/2023)   Social Connection and Isolation Panel    Frequency of Communication with Friends and Family: More than three times a week    Frequency of Social Gatherings with Friends and Family: Once a week    Attends Religious Services: 1 to 4 times per year    Active Member of [employer] or Organizations: Yes    Attends Engineer, Structural: More than 4 times per year    Marital Status: Married  Catering Manager Violence: Not on file     Constitutional: Denies fever, malaise, fatigue, headache or abrupt weight changes.  Respiratory: Denies difficulty breathing, shortness of breath, cough or sputum production.   Cardiovascular: Denies chest pain, chest tightness, palpitations or swelling in the hands or feet.  Gastrointestinal: Denies abdominal pain, bloating, constipation, diarrhea or blood in the stool.  Musculoskeletal: Denies decrease in range of motion, difficulty with gait, muscle pain  or joint pain and swelling.  Neurological: Patient reports dizziness.  Denies difficulty with memory, difficulty with speech or problems with balance and coordination.  Psych: Patient has a history of anxiety and depression.  Denies SI/HI.  No other specific complaints in a complete review of systems (except as listed in HPI above).  Observations/Objective:   Wt Readings from Last 3 Encounters:  12/15/23 106 lb 6.4 oz (48.3 kg)  08/21/23 105 lb (47.6 kg)  02/20/23 105 lb (47.6 kg)    General: Appears her stated age, well developed, well nourished in NAD. Pulmonary/Chest: Normal effort. No respiratory distress.  Neurological: Alert and oriented. Coordination normal.   BMET    Component Value Date/Time   NA 141 08/21/2023 0906   K 4.8 08/21/2023 0906   CL  105 08/21/2023 0906   CO2 26 08/21/2023 0906   GLUCOSE 87 08/21/2023 0906   BUN 12 08/21/2023 0906   CREATININE 0.81 08/21/2023 0906   CALCIUM  9.7 08/21/2023 0906   GFRNONAA >60 11/18/2020 1523    Lipid Panel     Component Value Date/Time   CHOL 171 08/21/2023 0906   TRIG 117 08/21/2023 0906   HDL 67 08/21/2023 0906   CHOLHDL 2.6 08/21/2023 0906   VLDL 30.0 02/08/2020 1541   LDLCALC 83 08/21/2023 0906    CBC    Component Value Date/Time   WBC 5.0 08/21/2023 0906   RBC 4.00 08/21/2023 0906   HGB 13.3 08/21/2023 0906   HCT 40.1 08/21/2023 0906   PLT 228 08/21/2023 0906   MCV 100.3 (H) 08/21/2023 0906   MCH 33.3 (H) 08/21/2023 0906   MCHC 33.2 08/21/2023 0906   RDW 11.8 08/21/2023 0906   LYMPHSABS 1.2 11/18/2020 1523   MONOABS 0.5 11/18/2020 1523   EOSABS 0.0 11/18/2020 1523   BASOSABS 0.0 11/18/2020 1523    Hgb A1C Lab Results  Component Value Date   HGBA1C 5.4 08/21/2023       Assessment and Plan:  Assessment and Plan    Vertigo and associated nausea Acute vertigo and nausea likely due to dehydration and prolonged head-down position. Meclizine effective. - Advise meclizine 25 mg every 8 hours  as needed for dizziness. - Encourage adequate hydration. - Refilled zofran  4 mg every 8 hours as needed for nausea.  Intolerance to lipid-lowering medications, hyperlipidemia, heart murmur Intolerance to statins and nonstatins due to side effects. Awaiting CT coronary calcium  score test results and echocardiogram. - Avoid lipid-lowering medications until cardiologist evaluation.  -Encouraged low-fat diet -She will continue to follow with cardiology      RTC in 2 months for your annual exam  Follow Up Instructions:    I discussed the assessment and treatment plan with the patient. The patient was provided an opportunity to ask questions and all were answered. The patient agreed with the plan and demonstrated an understanding of the instructions.   The patient was advised to call back or seek an in-person evaluation if the symptoms worsen or if the condition fails to improve as anticipated.   Angeline Laura, NP

## 2023-12-29 NOTE — Patient Instructions (Signed)
 Vertigo Vertigo is the feeling that you or the things around you are moving or spinning when they're not. It's different than feeling dizzy. It can also cause: Loss of balance. Trouble standing or walking. Nausea and vomiting. This feeling can come and go at any time. It can last from a few seconds to minutes or even hours. It may go away on its own or be treated with medicine. What are the types of vertigo? There are two types of vertigo: Peripheral vertigo happens when parts of your inner ear don't work like they should. This is the more common type. Central vertigo happens when your brain and spinal cord don't work like they should. Your health care provider will do tests to find out what kind of vertigo you have. This will help them decide on the right treatment for you. Follow these instructions at home: Eating and drinking Drink enough fluid to keep your pee (urine) pale yellow. Do not drink alcohol. Activity When you get up in the morning, first sit up on the side of the bed. When you feel okay, stand slowly while holding onto something. Move slowly. Avoid sudden body or head movements. Avoid certain positions, as told by your provider. Use a cane if you have trouble standing or walking. Sit down right away if you feel unsteady. Place items in your home so they're easy for you to reach without bending or leaning over. Return to normal activities when you're told. Ask what things are safe for you to do. General instructions Take your medicines only as told by your provider. Contact a health care provider if: Your medicines don't help or make your vertigo worse. You get new symptoms. You have a fever. You have nausea or vomiting. Your family or friends spot any changes in how you're acting. A part of your body goes numb. You feel tingling and prickling in a part of your body. You get very bad headaches. Get help right away if: You're always dizzy or you faint. You have a  stiff neck. You have trouble moving or speaking. Your hands, arms, or legs feel weak. Your hearing or eyesight changes. These symptoms may be an emergency. Call 911 right away. Do not wait to see if the symptoms will go away. Do not drive yourself to the hospital. This information is not intended to replace advice given to you by your health care provider. Make sure you discuss any questions you have with your health care provider. Document Revised: 11/20/2022 Document Reviewed: 05/23/2022 Elsevier Patient Education  2024 [employer].

## 2023-12-29 NOTE — Telephone Encounter (Signed)
 Patient identification verified by 2 forms.  Spoke with pt regarding wanting an order for a coronary calcium  score. Pt had one done in 2017 that came back at zero. Pt states that she is intolerant to recent addition of Bempedoic acid. Pt states that she and Dr. Floretta had discussed the potential of addition cholesterol therapy if pt's calcium  score is elevated. Will forward over to Dr. Floretta to advise.

## 2024-01-01 NOTE — Telephone Encounter (Signed)
 Spoke with pt and advised CT order has been placed and she will be contacted to schedule.  Pt verbalizes understanding and thanked CHARITY FUNDRAISER for the call.

## 2024-01-01 NOTE — Telephone Encounter (Signed)
 Calcium  Score CT order placed.

## 2024-01-06 ENCOUNTER — Ambulatory Visit: Payer: Self-pay | Admitting: Student in an Organized Health Care Education/Training Program

## 2024-01-06 ENCOUNTER — Ambulatory Visit (HOSPITAL_COMMUNITY)
Admission: RE | Admit: 2024-01-06 | Discharge: 2024-01-06 | Disposition: A | Discharge status: Home / Self Care | Payer: Self-pay | Source: Ambulatory Visit | Attending: Cardiology | Admitting: Cardiology

## 2024-01-06 ENCOUNTER — Ambulatory Visit (HOSPITAL_COMMUNITY)
Admission: RE | Admit: 2024-01-06 | Discharge: 2024-01-06 | Disposition: A | Discharge status: Home / Self Care | Source: Ambulatory Visit | Attending: Cardiology | Admitting: Cardiology

## 2024-01-06 DIAGNOSIS — R0789 Other chest pain: Secondary | ICD-10-CM | POA: Insufficient documentation

## 2024-01-06 DIAGNOSIS — R0609 Other forms of dyspnea: Secondary | ICD-10-CM | POA: Diagnosis not present

## 2024-01-06 DIAGNOSIS — E7800 Pure hypercholesterolemia, unspecified: Secondary | ICD-10-CM

## 2024-01-06 LAB — ECHOCARDIOGRAM COMPLETE
Area-P 1/2: 3.71 cm2
S' Lateral: 2.5 cm

## 2024-01-07 ENCOUNTER — Ambulatory Visit: Payer: Self-pay | Admitting: Student in an Organized Health Care Education/Training Program

## 2024-01-07 DIAGNOSIS — K7689 Other specified diseases of liver: Secondary | ICD-10-CM

## 2024-01-18 ENCOUNTER — Ambulatory Visit: Admitting: Internal Medicine

## 2024-01-20 NOTE — Addendum Note (Signed)
 Addended by: MANDA BOTTCHER B on: 01/20/2024 11:26 AM   Modules accepted: Orders

## 2024-01-29 ENCOUNTER — Other Ambulatory Visit: Payer: Self-pay | Admitting: Internal Medicine

## 2024-02-01 ENCOUNTER — Other Ambulatory Visit: Payer: Self-pay | Admitting: Internal Medicine

## 2024-02-01 DIAGNOSIS — Z1231 Encounter for screening mammogram for malignant neoplasm of breast: Secondary | ICD-10-CM

## 2024-02-02 NOTE — Telephone Encounter (Signed)
 No longer listed on current medication list Requested Prescriptions  Pending Prescriptions Disp Refills   rosuvastatin  (CRESTOR ) 10 MG tablet [Pharmacy Med Name: ROSUVASTATIN  CALCIUM  10 MG TAB] 90 tablet 0    Sig: TAKE 1 TABLET BY MOUTH EVERY DAY     Cardiovascular:  Antilipid - Statins 2 Failed - 02/02/2024  1:10 PM      Failed - Lipid Panel in normal range within the last 12 months    Cholesterol  Date Value Ref Range Status  08/21/2023 171 <200 mg/dL Final   LDL Cholesterol (Calc)  Date Value Ref Range Status  08/21/2023 83 mg/dL (calc) Final    Comment:    Reference range: <100 . Desirable range <100 mg/dL for primary prevention;   <70 mg/dL for patients with CHD or diabetic patients  with > or = 2 CHD risk factors. SABRA LDL-C is now calculated using the Martin-Hopkins  calculation, which is a validated novel method providing  better accuracy than the Friedewald equation in the  estimation of LDL-C.  Gladis APPLETHWAITE et al. SANDREA. 7986;689(80): 2061-2068  (http://education.[facility].com/faq/FAQ164)    HDL  Date Value Ref Range Status  08/21/2023 67 > OR = 50 mg/dL Final   Triglycerides  Date Value Ref Range Status  08/21/2023 117 <150 mg/dL Final         Passed - Cr in normal range and within 360 days    Creat  Date Value Ref Range Status  08/21/2023 0.81 0.50 - 1.05 mg/dL Final         Passed - Patient is not pregnant      Passed - Valid encounter within last 12 months    Recent Outpatient Visits           1 month ago Vertigo   [place] [facility] [place], Angeline ORN, NP   5 months ago Pure hypercholesterolemia   [place] [facility] [place], Angeline ORN, NP   9 months ago Exposure to influenza   [facility] [place], Angeline ORN, NP

## 2024-02-04 ENCOUNTER — Encounter: Payer: Self-pay | Admitting: Internal Medicine

## 2024-02-05 ENCOUNTER — Encounter: Payer: Self-pay | Admitting: Internal Medicine

## 2024-02-05 ENCOUNTER — Telehealth: Admitting: Internal Medicine

## 2024-02-05 ENCOUNTER — Other Ambulatory Visit: Payer: Self-pay | Admitting: Student in an Organized Health Care Education/Training Program

## 2024-02-05 DIAGNOSIS — J019 Acute sinusitis, unspecified: Secondary | ICD-10-CM

## 2024-02-05 DIAGNOSIS — R7989 Other specified abnormal findings of blood chemistry: Secondary | ICD-10-CM

## 2024-02-05 DIAGNOSIS — B9689 Other specified bacterial agents as the cause of diseases classified elsewhere: Secondary | ICD-10-CM | POA: Diagnosis not present

## 2024-02-05 DIAGNOSIS — K7689 Other specified diseases of liver: Secondary | ICD-10-CM

## 2024-02-05 MED ORDER — AZITHROMYCIN 250 MG PO TABS: ORAL_TABLET | ORAL | 0 refills | Status: DC

## 2024-02-05 NOTE — Progress Notes (Signed)
 Virtual Visit via Video Note  I connected with Katie Martinez on 02/05/24 at 11:40 AM EST by a video enabled telemedicine application and verified that I am speaking with the correct person using two identifiers.  Location: Patient: Home Provider: Office  Person's participating in this video call: Angeline Laura, NP-C and Katie Martinez.   I discussed the limitations of evaluation and management by telemedicine and the availability of in person appointments. The patient expressed understanding and agreed to proceed.  History of Present Illness:   Discussed the use of AI scribe software for clinical note transcription with the patient, who gave verbal consent to proceed.  Katie Martinez is a 63 year old female who presents with respiratory symptoms and sinus congestion.  She has been experiencing respiratory symptoms for the past two weeks, which have worsened this week. Initially, she had a general illness that has now developed into sinus congestion. Symptoms include nasal congestion with yellow-green discharge, sore throat, and a slight headache. No runny nose, ear pain, nausea, vomiting, diarrhea, fever, chills, or body aches.  She has been using Robitussin DM and Tylenol Severe Cold and Flu (daytime and nighttime formulations) to manage her symptoms. The Robitussin has been effective in helping her cough up mucus.  She has been exposed to rhinovirus.     Past Medical History:  Diagnosis Date   Hyperlipidemia    Hypertension     Current Outpatient Medications  Medication Sig Dispense Refill   amLODipine  (NORVASC ) 5 MG tablet Take 1 tablet (5 mg total) by mouth daily. 90 tablet 0   Bempedoic Acid  180 MG TABS Take 1 tablet (180 mg total) by mouth daily. 90 tablet 3   BIOTIN PO Take by mouth.     Cholecalciferol (VITAMIN D) 50 MCG (2000 UT) CAPS Take by mouth.     olmesartan  (BENICAR ) 20 MG tablet Take 1 tablet (20 mg total) by mouth daily. 90 tablet 0    ondansetron  (ZOFRAN  ODT) 4 MG disintegrating tablet Take 1 tablet (4 mg total) by mouth every 8 (eight) hours as needed for nausea or vomiting. 20 tablet 0   No current facility-administered medications for this visit.    Allergies  Allergen Reactions   Lisinopril Other (See Comments)    Dry cough     Metoprolol Other (See Comments)    Vivid dreams, dizziness    Family History  Problem Relation Age of Onset   Lymphoma Mother    Hypertension Mother    Hyperlipidemia Mother    Cancer Mother    Arthritis Father    Hyperlipidemia Father    Hypertension Father    Stroke Father    Hyperlipidemia Brother    Hypertension Brother    Prostate cancer Brother    Skin cancer Brother    Hypertension Brother    Hyperlipidemia Brother    Prostate cancer Brother    Skin cancer Brother    Arthritis Maternal Grandmother    Lymphoma Maternal Grandmother    Stroke Maternal Grandmother    Brain cancer Maternal Grandfather    Hypertension Maternal Grandfather    Hyperlipidemia Maternal Grandfather    Heart disease Maternal Grandfather    Heart disease Paternal Grandmother    Hyperlipidemia Paternal Grandmother    Hypertension Paternal Grandmother    Stroke Paternal Grandfather    Arthritis Paternal Grandfather    Breast cancer Maternal Aunt    Breast cancer Maternal Aunt     Social History   Socioeconomic History  Marital status: Married    Spouse name: Not on file   Number of children: Not on file   Years of education: Not on file   Highest education level: Associate degree: academic program  Occupational History   Not on file  Tobacco Use   Smoking status: Never   Smokeless tobacco: Never  Vaping Use   Vaping status: Never Used  Substance and Sexual Activity   Alcohol use: Yes    Comment: 2 glasses at night   Drug use: Never   Sexual activity: Not on file  Other Topics Concern   Not on file  Social History Narrative   Not on file   Social Drivers of Health    Financial Resource Strain: Low Risk  (12/29/2023)   Overall Financial Resource Strain (CARDIA)    Difficulty of Paying Living Expenses: Not hard at all  Food Insecurity: No Food Insecurity (12/29/2023)   Hunger Vital Sign    Worried About Running Out of Food in the Last Year: Never true    Ran Out of Food in the Last Year: Never true  Transportation Needs: No Transportation Needs (12/29/2023)   PRAPARE - Administrator, Civil Service (Medical): No    Lack of Transportation (Non-Medical): No  Physical Activity: Insufficiently Active (12/29/2023)   Exercise Vital Sign    Days of Exercise per Week: 3 days    Minutes of Exercise per Session: 30 min  Stress: No Stress Concern Present (12/29/2023)   [employer] of Occupational Health - Occupational Stress Questionnaire    Feeling of Stress: Only a little  Social Connections: Socially Integrated (12/29/2023)   Social Connection and Isolation Panel    Frequency of Communication with Friends and Family: More than three times a week    Frequency of Social Gatherings with Friends and Family: Once a week    Attends Religious Services: 1 to 4 times per year    Active Member of [employer] or Organizations: Yes    Attends Engineer, Structural: More than 4 times per year    Marital Status: Married  Catering Manager Violence: Not on file     Constitutional: Pt reports headache. Denies fever, malaise, fatigue, headache or abrupt weight changes.  HEENT: Pt reports nasal congestion and sore throat.  Denies runny nose, ear pain, wax buildup or bloody nose. Respiratory: Pt reports cough. Denies difficulty breathing, shortness of breath, cough or sputum production.   Cardiovascular: Denies chest pain, chest tightness, palpitations or swelling in the hands or feet.  Gastrointestinal: Denies abdominal pain, bloating, constipation, diarrhea or blood in the stool.  Musculoskeletal: Denies decrease in range of motion, difficulty with  gait, muscle pain or joint pain and swelling.  Neurological: Patient reports dizziness.  Denies difficulty with memory, difficulty with speech or problems with balance and coordination.  Psych: Patient has a history of anxiety and depression.  Denies SI/HI.  No other specific complaints in a complete review of systems (except as listed in HPI above).  Observations/Objective:   Wt Readings from Last 3 Encounters:  12/15/23 106 lb 6.4 oz (48.3 kg)  08/21/23 105 lb (47.6 kg)  02/20/23 105 lb (47.6 kg)    General: Appears her stated age, appears unwell but in NAD. HEENT: Head: Normal shape and size, maxillary sinus pressure noted; Nose: congestion noted; Throat: hoarseness noted. Pulmonary/Chest: Normal effort. No respiratory distress.  Neurological: Alert and oriented. Coordination normal.   BMET    Component Value Date/Time   NA 141  08/21/2023 0906   K 4.8 08/21/2023 0906   CL 105 08/21/2023 0906   CO2 26 08/21/2023 0906   GLUCOSE 87 08/21/2023 0906   BUN 12 08/21/2023 0906   CREATININE 0.81 08/21/2023 0906   CALCIUM  9.7 08/21/2023 0906   GFRNONAA >60 11/18/2020 1523    Lipid Panel     Component Value Date/Time   CHOL 171 08/21/2023 0906   TRIG 117 08/21/2023 0906   HDL 67 08/21/2023 0906   CHOLHDL 2.6 08/21/2023 0906   VLDL 30.0 02/08/2020 1541   LDLCALC 83 08/21/2023 0906    CBC    Component Value Date/Time   WBC 5.0 08/21/2023 0906   RBC 4.00 08/21/2023 0906   HGB 13.3 08/21/2023 0906   HCT 40.1 08/21/2023 0906   PLT 228 08/21/2023 0906   MCV 100.3 (H) 08/21/2023 0906   MCH 33.3 (H) 08/21/2023 0906   MCHC 33.2 08/21/2023 0906   RDW 11.8 08/21/2023 0906   LYMPHSABS 1.2 11/18/2020 1523   MONOABS 0.5 11/18/2020 1523   EOSABS 0.0 11/18/2020 1523   BASOSABS 0.0 11/18/2020 1523    Hgb A1C Lab Results  Component Value Date   HGBA1C 5.4 08/21/2023       Assessment and Plan:   Assessment and Plan    Acute bacterial sinusitis Symptoms and clinical  presentation indicate bacterial sinusitis despite positive rhinovirus test. Azithromycin  selected to minimize gastrointestinal side effects. - Encouraged rest and fluids - Can use a Nettie pot which can be purchased from your local pharmacy - Prescribed azithromycin  (Z-Pak) 250 mg x 5 days. - Continue Robitussin DM for cough.      RTC in 2 weeks for your annual exam  Follow Up Instructions:    I discussed the assessment and treatment plan with the patient. The patient was provided an opportunity to ask questions and all were answered. The patient agreed with the plan and demonstrated an understanding of the instructions.   The patient was advised to call back or seek an in-person evaluation if the symptoms worsen or if the condition fails to improve as anticipated.   Angeline Laura, NP

## 2024-02-05 NOTE — Patient Instructions (Signed)

## 2024-02-08 ENCOUNTER — Ambulatory Visit
Admission: RE | Admit: 2024-02-08 | Discharge: 2024-02-08 | Disposition: A | Source: Ambulatory Visit | Attending: Student in an Organized Health Care Education/Training Program

## 2024-02-08 ENCOUNTER — Other Ambulatory Visit: Payer: Self-pay | Admitting: Internal Medicine

## 2024-02-08 DIAGNOSIS — R7989 Other specified abnormal findings of blood chemistry: Secondary | ICD-10-CM

## 2024-02-08 DIAGNOSIS — Z1231 Encounter for screening mammogram for malignant neoplasm of breast: Secondary | ICD-10-CM

## 2024-02-08 DIAGNOSIS — K7689 Other specified diseases of liver: Secondary | ICD-10-CM | POA: Diagnosis not present

## 2024-02-13 ENCOUNTER — Ambulatory Visit: Payer: Self-pay | Admitting: Student in an Organized Health Care Education/Training Program

## 2024-02-21 NOTE — Progress Notes (Unsigned)
 "  Subjective:    Patient ID: Katie Martinez, female    DOB: 1960-07-28, 63 y.o.   MRN: [id number]  HPI  Patient presents to clinic today for her annual exam.  Flu: 02/2024 Tetanus: 02/2023 COVID: Pfizer x 2 Shingrix: 09/2018, 12/2018 Pap smear: 01/2022 Mammogram: 03/2023 Bone density: 12/2022 Colon screening: 01/2021 Vision screening: annually Dentist: biannually  Diet: She does eat meat. She consumes fruits and veggies. She tries to avoid fried foods. She drinks mostly water, sweet tea. Exercise: None  Review of Systems     Past Medical History:  Diagnosis Date   Hyperlipidemia    Hypertension     Current Outpatient Medications  Medication Sig Dispense Refill   amLODipine  (NORVASC ) 5 MG tablet Take 1 tablet (5 mg total) by mouth daily. 90 tablet 0   azithromycin  (ZITHROMAX ) 250 MG tablet Take 2 tabs today, then 1 tab daily x 4 days 6 tablet 0   Bempedoic Acid  180 MG TABS Take 1 tablet (180 mg total) by mouth daily. 90 tablet 3   BIOTIN PO Take by mouth.     Cholecalciferol (VITAMIN D) 50 MCG (2000 UT) CAPS Take by mouth.     olmesartan  (BENICAR ) 20 MG tablet Take 1 tablet (20 mg total) by mouth daily. 90 tablet 0   ondansetron  (ZOFRAN  ODT) 4 MG disintegrating tablet Take 1 tablet (4 mg total) by mouth every 8 (eight) hours as needed for nausea or vomiting. 20 tablet 0   No current facility-administered medications for this visit.    Allergies  Allergen Reactions   Lisinopril Other (See Comments)    Dry cough     Metoprolol Other (See Comments)    Vivid dreams, dizziness    Family History  Problem Relation Age of Onset   Lymphoma Mother    Hypertension Mother    Hyperlipidemia Mother    Cancer Mother    Arthritis Father    Hyperlipidemia Father    Hypertension Father    Stroke Father    Hyperlipidemia Brother    Hypertension Brother    Prostate cancer Brother    Skin cancer Brother    Hypertension Brother     Hyperlipidemia Brother    Prostate cancer Brother    Skin cancer Brother    Arthritis Maternal Grandmother    Lymphoma Maternal Grandmother    Stroke Maternal Grandmother    Brain cancer Maternal Grandfather    Hypertension Maternal Grandfather    Hyperlipidemia Maternal Grandfather    Heart disease Maternal Grandfather    Heart disease Paternal Grandmother    Hyperlipidemia Paternal Grandmother    Hypertension Paternal Grandmother    Stroke Paternal Grandfather    Arthritis Paternal Grandfather    Breast cancer Maternal Aunt    Breast cancer Maternal Aunt     Social History   Socioeconomic History   Marital status: Married    Spouse name: Not on file   Number of children: Not on file   Years of education: Not on file   Highest education level: Associate degree: academic program  Occupational History   Not on file  Tobacco Use   Smoking status: Never   Smokeless tobacco: Never  Vaping Use   Vaping status: Never Used  Substance and Sexual Activity   Alcohol use: Yes    Comment: 2 glasses at night   Drug use: Never   Sexual activity: Not on file  Other Topics Concern   Not on file  Social History Narrative  Not on file   Social Drivers of Health   Tobacco Use: Low Risk (02/05/2024)   Patient History    Smoking Tobacco Use: Never    Smokeless Tobacco Use: Never    Passive Exposure: Not on file  Financial Resource Strain: Low Risk (12/29/2023)   Overall Financial Resource Strain (CARDIA)    Difficulty of Paying Living Expenses: Not hard at all  Food Insecurity: No Food Insecurity (12/29/2023)   Epic    Worried About Programme Researcher, Broadcasting/film/video in the Last Year: Never true    Ran Out of Food in the Last Year: Never true  Transportation Needs: No Transportation Needs (12/29/2023)   Epic    Lack of Transportation (Medical): No    Lack of Transportation (Non-Medical): No  Physical Activity: Insufficiently Active (12/29/2023)    Exercise Vital Sign    Days of Exercise per Week: 3 days    Minutes of Exercise per Session: 30 min  Stress: No Stress Concern Present (12/29/2023)   [employer] of Occupational Health - Occupational Stress Questionnaire    Feeling of Stress: Only a little  Social Connections: Socially Integrated (12/29/2023)   Social Connection and Isolation Panel    Frequency of Communication with Friends and Family: More than three times a week    Frequency of Social Gatherings with Friends and Family: Once a week    Attends Religious Services: 1 to 4 times per year    Active Member of [employer] or Organizations: Yes    Attends Engineer, Structural: More than 4 times per year    Marital Status: Married  Catering Manager Violence: Not on file  Depression (PHQ2-9): Low Risk (08/21/2023)   Depression (PHQ2-9)    PHQ-2 Score: 0  Alcohol Screen: Low Risk (12/29/2023)   Alcohol Screen    Last Alcohol Screening Score (AUDIT): 3  Housing: Low Risk (12/29/2023)   Epic    Unable to Pay for Housing in the Last Year: No    Number of Times Moved in the Last Year: 0    Homeless in the Last Year: No  Utilities: Not on file  Health Literacy: Not on file     Constitutional: Denies fever, malaise, fatigue, headache or abrupt weight changes.  HEENT: Denies eye pain, eye redness, ear pain, ringing in the ears, wax buildup, runny nose, nasal congestion, bloody nose, or sore throat. Respiratory: Denies difficulty breathing, shortness of breath, cough or sputum production.   Cardiovascular: Denies chest pain, chest tightness, palpitations or swelling in the hands or feet.  Gastrointestinal:  Pt reports intermittent reflux. Denies abdominal pain, bloating, constipation, diarrhea or blood in the stool.  GU: Denies urgency, frequency, pain with urination, burning sensation, blood in urine, odor or discharge. Musculoskeletal: Denies decrease in range of motion, difficulty with gait, muscle pain  or joint pain and swelling.  Skin: Denies redness, rashes, lesions or ulcercations.  Neurological: Pt reports insomnia. Denies dizziness, difficulty with memory, difficulty with speech or problems with balance and coordination.  Psych: Patient has a history of anxiety and depression.  Denies SI/HI.  No other specific complaints in a complete review of systems (except as listed in HPI above).  Objective:   Physical Exam  BP 112/68   Pulse 88   Ht 5' (1.524 m)   Wt 109 lb (49.4 kg)   SpO2 100%   BMI 21.29 kg/m     Wt Readings from Last 3 Encounters:  12/15/23 106 lb 6.4 oz (48.3 kg)  08/21/23  105 lb (47.6 kg)  02/20/23 105 lb (47.6 kg)    General: Appears her stated age, well developed, well nourished in NAD. Skin: Warm, dry and intact. No rashes noted. HEENT: Head: normal shape and size; Eyes: sclera white, no icterus, conjunctiva pink, PERRLA and EOMs intact;  Neck:  Neck supple, trachea midline. No masses, lumps or thyromegaly present.  Cardiovascular: Normal rate and rhythm. S1,S2 noted.  No murmur, rubs or gallops noted. No JVD or BLE edema. No carotid bruits noted. Pulmonary/Chest: Normal effort and positive vesicular breath sounds. No respiratory distress. No wheezes, rales or ronchi noted.  Abdomen: Normal bowel sounds.  Musculoskeletal: Mild kyphosis noted.  Strength 5/5 BUE/BLE.  No difficulty with gait.  Neurological: Alert and oriented. Cranial nerves II-XII grossly intact. Coordination normal.  Psychiatric: Mood and affect normal. Behavior is normal. Judgment and thought content normal.    BMET    Component Value Date/Time   NA 141 08/21/2023 0906   K 4.8 08/21/2023 0906   CL 105 08/21/2023 0906   CO2 26 08/21/2023 0906   GLUCOSE 87 08/21/2023 0906   BUN 12 08/21/2023 0906   CREATININE 0.81 08/21/2023 0906   CALCIUM  9.7 08/21/2023 0906   GFRNONAA >60 11/18/2020 1523    Lipid Panel     Component Value Date/Time   CHOL 171 08/21/2023 0906   TRIG 117  08/21/2023 0906   HDL 67 08/21/2023 0906   CHOLHDL 2.6 08/21/2023 0906   VLDL 30.0 02/08/2020 1541   LDLCALC 83 08/21/2023 0906    CBC    Component Value Date/Time   WBC 5.0 08/21/2023 0906   RBC 4.00 08/21/2023 0906   HGB 13.3 08/21/2023 0906   HCT 40.1 08/21/2023 0906   PLT 228 08/21/2023 0906   MCV 100.3 (H) 08/21/2023 0906   MCH 33.3 (H) 08/21/2023 0906   MCHC 33.2 08/21/2023 0906   RDW 11.8 08/21/2023 0906   LYMPHSABS 1.2 11/18/2020 1523   MONOABS 0.5 11/18/2020 1523   EOSABS 0.0 11/18/2020 1523   BASOSABS 0.0 11/18/2020 1523    Hgb A1C Lab Results  Component Value Date   HGBA1C 5.4 08/21/2023           Assessment & Plan:   Preventative Health Maintenance:  Flu shot UTD Tdap UTD Encouraged her to get her COVID booster Shingrix UTD Pap smear UTD Mammogram already scheduled Bone density UTD Colon screening UTD Encouraged her to consume a balanced diet and exercise regimen Advised her to see an eye doctor and dentist annually Will check CBC, c-Met, lipid, A1c today  RTC in 6 months, follow-up chronic conditions Angeline Laura, NP  "

## 2024-02-22 ENCOUNTER — Encounter: Payer: Self-pay | Admitting: Internal Medicine

## 2024-02-22 ENCOUNTER — Encounter: Admitting: Internal Medicine

## 2024-02-22 VITALS — BP 112/68 | HR 88 | Ht 60.0 in | Wt 109.0 lb

## 2024-02-22 DIAGNOSIS — E7800 Pure hypercholesterolemia, unspecified: Secondary | ICD-10-CM

## 2024-02-22 DIAGNOSIS — I700 Atherosclerosis of aorta: Secondary | ICD-10-CM | POA: Insufficient documentation

## 2024-02-22 DIAGNOSIS — Z0001 Encounter for general adult medical examination with abnormal findings: Secondary | ICD-10-CM

## 2024-02-22 DIAGNOSIS — R739 Hyperglycemia, unspecified: Secondary | ICD-10-CM | POA: Diagnosis not present

## 2024-02-22 LAB — COMPREHENSIVE METABOLIC PANEL WITH GFR
AG Ratio: 1.5 (calc) (ref 1.0–2.5)
ALT: 19 U/L (ref 6–29)
AST: 23 U/L (ref 10–35)
Albumin: 4.5 g/dL (ref 3.6–5.1)
Alkaline phosphatase (APISO): 52 U/L (ref 37–153)
BUN: 9 mg/dL (ref 7–25)
CO2: 29 mmol/L (ref 20–32)
Calcium: 9.5 mg/dL (ref 8.6–10.4)
Chloride: 102 mmol/L (ref 98–110)
Creat: 0.69 mg/dL (ref 0.50–1.05)
Globulin: 3 g/dL (ref 1.9–3.7)
Glucose, Bld: 90 mg/dL (ref 65–99)
Potassium: 4.5 mmol/L (ref 3.5–5.3)
Sodium: 140 mmol/L (ref 135–146)
Total Bilirubin: 0.7 mg/dL (ref 0.2–1.2)
Total Protein: 7.5 g/dL (ref 6.1–8.1)
eGFR: 97 mL/min/1.73m2

## 2024-02-22 LAB — HEMOGLOBIN A1C
Hgb A1c MFr Bld: 5.2 %
Mean Plasma Glucose: 103 mg/dL
eAG (mmol/L): 5.7 mmol/L

## 2024-02-22 LAB — CBC
HCT: 38.6 % (ref 35.9–46.0)
Hemoglobin: 12.8 g/dL (ref 11.7–15.5)
MCH: 32.5 pg (ref 27.0–33.0)
MCHC: 33.2 g/dL (ref 31.6–35.4)
MCV: 98 fL (ref 81.4–101.7)
MPV: 10 fL (ref 7.5–12.5)
Platelets: 296 Thousand/uL (ref 140–400)
RBC: 3.94 Million/uL (ref 3.80–5.10)
RDW: 11.8 % (ref 11.0–15.0)
WBC: 7 Thousand/uL (ref 3.8–10.8)

## 2024-02-22 LAB — LIPID PANEL
Cholesterol: 231 mg/dL — ABNORMAL HIGH
HDL: 65 mg/dL
LDL Cholesterol (Calc): 143 mg/dL — ABNORMAL HIGH
Non-HDL Cholesterol (Calc): 166 mg/dL — ABNORMAL HIGH
Total CHOL/HDL Ratio: 3.6 (calc)
Triglycerides: 115 mg/dL

## 2024-02-22 MED ORDER — OLMESARTAN MEDOXOMIL 20 MG PO TABS: 20.0000 mg | ORAL_TABLET | Freq: Every day | ORAL | 1 refills | Status: AC

## 2024-02-22 MED ORDER — AMLODIPINE BESYLATE 5 MG PO TABS: 5.0000 mg | ORAL_TABLET | Freq: Every day | ORAL | 1 refills | Status: AC

## 2024-02-22 NOTE — Patient Instructions (Signed)
 Health Maintenance for Postmenopausal Women Menopause is a normal process in which your ability to get pregnant comes to an end. This process happens slowly over many months or years, usually between the ages of 76 and 38. Menopause is complete when you have missed your menstrual period for 12 months. It is important to talk with your health care provider about some of the most common conditions that affect women after menopause (postmenopausal women). These include heart disease, cancer, and bone loss (osteoporosis). Adopting a healthy lifestyle and getting preventive care can help to promote your health and wellness. The actions you take can also lower your chances of developing some of these common conditions. What are the signs and symptoms of menopause? During menopause, you may have the following symptoms: Hot flashes. These can be moderate or severe. Night sweats. Decrease in sex drive. Mood swings. Headaches. Tiredness (fatigue). Irritability. Memory problems. Problems falling asleep or staying asleep. Talk with your health care provider about treatment options for your symptoms. Do I need hormone replacement therapy? Hormone replacement therapy is effective in treating symptoms that are caused by menopause, such as hot flashes and night sweats. Hormone replacement carries certain risks, especially as you become older. If you are thinking about using estrogen or estrogen with progestin, discuss the benefits and risks with your health care provider. How can I reduce my risk for heart disease and stroke? The risk of heart disease, heart attack, and stroke increases as you age. One of the causes may be a change in the body's hormones during menopause. This can affect how your body uses dietary fats, triglycerides, and cholesterol. Heart attack and stroke are medical emergencies. There are many things that you can do to help prevent heart disease and stroke. Watch your blood pressure High  blood pressure causes heart disease and increases the risk of stroke. This is more likely to develop in people who have high blood pressure readings or are overweight. Have your blood pressure checked: Every 3-5 years if you are 32-23 years of age. Every year if you are 31 years old or older. Eat a healthy diet  Eat a diet that includes plenty of vegetables, fruits, low-fat dairy products, and lean protein. Do not eat a lot of foods that are high in solid fats, added sugars, or sodium. Get regular exercise Get regular exercise. This is one of the most important things you can do for your health. Most adults should: Try to exercise for at least 150 minutes each week. The exercise should increase your heart rate and make you sweat (moderate-intensity exercise). Try to do strengthening exercises at least twice each week. Do these in addition to the moderate-intensity exercise. Spend less time sitting. Even light physical activity can be beneficial. Other tips Work with your health care provider to achieve or maintain a healthy weight. Do not use any products that contain nicotine or tobacco. These products include cigarettes, chewing tobacco, and vaping devices, such as e-cigarettes. If you need help quitting, ask your health care provider. Know your numbers. Ask your health care provider to check your cholesterol and your blood sugar (glucose). Continue to have your blood tested as directed by your health care provider. Do I need screening for cancer? Depending on your health history and family history, you may need to have cancer screenings at different stages of your life. This may include screening for: Breast cancer. Cervical cancer. Lung cancer. Colorectal cancer. What is my risk for osteoporosis? After menopause, you may be  at increased risk for osteoporosis. Osteoporosis is a condition in which bone destruction happens more quickly than new bone creation. To help prevent osteoporosis or  the bone fractures that can happen because of osteoporosis, you may take the following actions: If you are 24-54 years old, get at least 1,000 mg of calcium and at least 600 international units (IU) of vitamin D  per day. If you are older than age 75 but younger than age 30, get at least 1,200 mg of calcium and at least 600 international units (IU) of vitamin D  per day. If you are older than age 8, get at least 1,200 mg of calcium and at least 800 international units (IU) of vitamin D  per day. Smoking and drinking excessive alcohol increase the risk of osteoporosis. Eat foods that are rich in calcium and vitamin D , and do weight-bearing exercises several times each week as directed by your health care provider. How does menopause affect my mental health? Depression may occur at any age, but it is more common as you become older. Common symptoms of depression include: Feeling depressed. Changes in sleep patterns. Changes in appetite or eating patterns. Feeling an overall lack of motivation or enjoyment of activities that you previously enjoyed. Frequent crying spells. Talk with your health care provider if you think that you are experiencing any of these symptoms. General instructions See your health care provider for regular wellness exams and vaccines. This may include: Scheduling regular health, dental, and eye exams. Getting and maintaining your vaccines. These include: Influenza vaccine. Get this vaccine each year before the flu season begins. Pneumonia vaccine. Shingles vaccine. Tetanus, diphtheria, and pertussis (Tdap) booster vaccine. Your health care provider may also recommend other immunizations. Tell your health care provider if you have ever been abused or do not feel safe at home. Summary Menopause is a normal process in which your ability to get pregnant comes to an end. This condition causes hot flashes, night sweats, decreased interest in sex, mood swings, headaches, or lack  of sleep. Treatment for this condition may include hormone replacement therapy. Take actions to keep yourself healthy, including exercising regularly, eating a healthy diet, watching your weight, and checking your blood pressure and blood sugar levels. Get screened for cancer and depression. Make sure that you are up to date with all your vaccines. This information is not intended to replace advice given to you by your health care provider. Make sure you discuss any questions you have with your health care provider. Document Revised: 07/09/2020 Document Reviewed: 07/09/2020 Elsevier Patient Education  2024 [employer].

## 2024-02-23 ENCOUNTER — Ambulatory Visit: Payer: Self-pay | Admitting: Internal Medicine

## 2024-02-24 MED ORDER — EZETIMIBE 10 MG PO TABS: 10.0000 mg | ORAL_TABLET | Freq: Every day | ORAL | 3 refills | Status: AC

## 2024-03-02 ENCOUNTER — Ambulatory Visit
Admission: RE | Admit: 2024-03-02 | Discharge: 2024-03-02 | Disposition: A | Discharge status: Home / Self Care | Source: Ambulatory Visit | Attending: Internal Medicine | Admitting: Internal Medicine

## 2024-03-02 ENCOUNTER — Ambulatory Visit

## 2024-03-02 DIAGNOSIS — Z1231 Encounter for screening mammogram for malignant neoplasm of breast: Secondary | ICD-10-CM | POA: Insufficient documentation

## 2024-03-10 ENCOUNTER — Encounter: Payer: Self-pay | Admitting: Student in an Organized Health Care Education/Training Program

## 2024-08-22 ENCOUNTER — Ambulatory Visit: Admitting: Internal Medicine
